# Patient Record
Sex: Male | Born: 1980 | Race: White | Hispanic: No | Marital: Married | Smoking: Never smoker
Health system: Southern US, Community
[De-identification: ages and names within clinical notes are randomized; demographics above are authoritative.]

## PROBLEM LIST (undated history)

## (undated) DIAGNOSIS — I10 Essential (primary) hypertension: Secondary | ICD-10-CM

## (undated) DIAGNOSIS — Z87442 Personal history of urinary calculi: Secondary | ICD-10-CM

## (undated) DIAGNOSIS — F419 Anxiety disorder, unspecified: Secondary | ICD-10-CM

## (undated) DIAGNOSIS — M93003 Unspecified slipped upper femoral epiphysis (nontraumatic), unspecified hip: Secondary | ICD-10-CM

## (undated) DIAGNOSIS — M9380 Other specified osteochondropathies of unspecified site: Secondary | ICD-10-CM

## (undated) HISTORY — DX: Other specified osteochondropathies of unspecified site: M93.80

## (undated) HISTORY — DX: Essential (primary) hypertension: I10

## (undated) HISTORY — DX: Unspecified slipped upper femoral epiphysis (nontraumatic), unspecified hip: M93.003

## (undated) HISTORY — DX: Anxiety disorder, unspecified: F41.9

## (undated) HISTORY — PX: ANKLE FRACTURE SURGERY: SHX122

---
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
## 2002-05-15 HISTORY — PX: APPENDECTOMY: SHX54

## 2014-10-14 ENCOUNTER — Ambulatory Visit (INDEPENDENT_AMBULATORY_CARE_PROVIDER_SITE_OTHER): Payer: 59 | Admitting: Family Medicine

## 2014-10-14 ENCOUNTER — Encounter: Payer: Self-pay | Admitting: Family Medicine

## 2014-10-14 VITALS — BP 131/88 | HR 74 | Temp 98.1°F | Wt 258.0 lb

## 2014-10-14 DIAGNOSIS — E669 Obesity, unspecified: Secondary | ICD-10-CM

## 2014-10-14 DIAGNOSIS — I10 Essential (primary) hypertension: Secondary | ICD-10-CM

## 2014-10-14 DIAGNOSIS — I129 Hypertensive chronic kidney disease with stage 1 through stage 4 chronic kidney disease, or unspecified chronic kidney disease: Secondary | ICD-10-CM | POA: Insufficient documentation

## 2014-10-14 DIAGNOSIS — E7800 Pure hypercholesterolemia, unspecified: Secondary | ICD-10-CM

## 2014-10-14 DIAGNOSIS — Z8659 Personal history of other mental and behavioral disorders: Secondary | ICD-10-CM | POA: Diagnosis not present

## 2014-10-14 DIAGNOSIS — J309 Allergic rhinitis, unspecified: Secondary | ICD-10-CM | POA: Insufficient documentation

## 2014-10-14 DIAGNOSIS — R231 Pallor: Secondary | ICD-10-CM

## 2014-10-14 DIAGNOSIS — J302 Other seasonal allergic rhinitis: Secondary | ICD-10-CM | POA: Diagnosis not present

## 2014-10-14 NOTE — Assessment & Plan Note (Signed)
I would not be inclined to start him on stimulant with HTN; discussed other options (life coach, exercise, frequent timed breaks, etc.)

## 2014-10-14 NOTE — Assessment & Plan Note (Signed)
Stable, [place]; may use OTC oral or nasal corticosteroid

## 2014-10-14 NOTE — Patient Instructions (Addendum)
Your goal blood pressure is less than 140/90; ideal is less than 120/80 Try to limit the sodium in your diet.  Ideally, consume less than 1.5 grams (less than 1,500mg ) per day. Do not add salt when cooking or at the table.  Check the sodium amount on labels when shopping, and choose items lower in sodium when given a choice. Eat a diet rich in fruits and vegetables and whole grains. We'll contact you about your labs or you can enroll in MyChart

## 2014-10-14 NOTE — Assessment & Plan Note (Signed)
DASH guidelines encouraged; he will try to increase activity, lose weight; continue current meds; may refill prior to next appt if needed;  Last creatinine reviewed, normal

## 2014-10-14 NOTE — Progress Notes (Signed)
[name], male   DOB: Feb 15, 1981, 34 y.o.   MRN: [id number]   Subjective:   Gordon Carson is a 34 y.o. male for routine f/u for blood pressure and other issues  He checks his BP occasionally and it runs about what today's reading is; no high readings since last visit He can tell when BP is up at the end of a stressful day; he is doing much better with limiting salt Going to change jobs soon, getting married in August; having to study for licensing exams Weight fluctuates Nonsmoker, occasional alcohol Fairly sedentary lifestyle  Took escitalopram but not any more; doing well in those regards with anxiety  Allergies have been fair this year; takes Zyrtec as needed  He brought up another issue at the end of the visit; hx of ADHD; his sister was diagnosed with ADHD, and mother thought that sounded like patient; he actually took low-dose Adderall during Freshman and Sophomore years in college; high school had been easy; did well in college but not sure he would have if he hadn't had the extra help; then went to grad school and went on 5 mg Adderall, lower dose than was needed in college; that was helpful for him with studying; just wanted to ask  Past Medical History  Diagnosis Date  . Anxiety   . Hypertension   . Slipped epiphysis    Past Surgical History  Procedure Laterality Date  . Hip surgery  1998    pin placed  . Appendectomy  2004   Family History  Problem Relation Age of Onset  . Cancer Mother     breast  . Hypertension Maternal Grandfather   . Stroke Maternal Grandfather   . Heart disease Maternal Grandfather    History  Substance Use Topics  . Smoking status: Never Smoker   . Smokeless tobacco: Never Used  . Alcohol Use: No   Current Outpatient Prescriptions on File Prior to Visit  Medication Sig Dispense Refill  . amLODipine (NORVASC) 2.5 MG tablet Take 2.5 mg by mouth daily.    . cetirizine (ZYRTEC) 10 MG tablet Take 10 mg  by mouth daily.    . fluticasone (FLONASE) 50 MCG/ACT nasal spray Place into both nostrils daily.     No current facility-administered medications on file prior to visit.   Allergies  Allergen Reactions  . Codeine    ------------- Review of Systems  Constitutional: Negative for fever.  HENT: Negative for congestion, ear pain and nosebleeds.   Cardiovascular: Positive for palpitations. Negative for leg swelling.       Heart beat skips once or twice a day, nothing new  Gastrointestinal: Negative for blood in stool.  Skin: Negative for rash.  Psychiatric/Behavioral: The patient is not nervous/anxious and does not have insomnia.     No results found for: WBC, HGB, HCT, PLT, GLUCOSE, CHOL, TRIG, HDL, LDLDIRECT, LDLCALC, ALT, AST, NA, K, CL, CREATININE, BUN, CO2, TSH, PSA, INR, GLUF, HGBA1C, MICROALBUR  Objective:   Filed Vitals:   10/14/14 1506  BP: 131/88  Pulse: 74  Temp: 98.1 F (36.7 C)  Weight: 258 lb (117.028 kg)  SpO2: 97%   Body mass index is 34.05 kg/(m^2). Physical Exam  Constitutional: He appears well-developed and well-nourished.  obese  HENT:  Mucous membranes moist, no significant rhinorrhea  Eyes: Conjunctivae and EOM are normal.  Neck: No thyromegaly present.  Cardiovascular: Normal rate, regular rhythm and normal heart sounds.   No extrasystoles are present. Exam reveals  no friction rub.   No murmur heard. Pulmonary/Chest: Effort normal and breath sounds normal. No respiratory distress. He has no wheezes.  Abdominal:  nondistended  Musculoskeletal: He exhibits no edema.  No gross deformities  Neurological: He is alert. He has normal strength. He displays no tremor. Gait normal.  Normal gait, no tics or tremors  Skin: No rash noted. No cyanosis. There is pallor. Nails show no clubbing.  Psychiatric:  Good eye contact with examiner, euthymic    Assessment/Plan:   Problem List Items Addressed This Visit      Cardiovascular and Mediastinum    Essential hypertension, benign - Primary    DASH guidelines encouraged; he will try to increase activity, lose weight; continue current meds; may refill prior to next appt if needed;  Last creatinine reviewed, normal        Respiratory   Allergic rhinitis    Stable, exacerbated [place] the [place]; may use OTC oral or nasal corticosteroid        Other   Obesity    What It Takes to Lose Weight hand-out from [email or web address] given to the patient; encouraged weight loss of 1-2 pounds per week; do not use OTC weight loss pills or gimmicks that claim rapid weight loss      History of ADHD    I would not be inclined to start him on stimulant with HTN; discussed other options (life coach, exercise, frequent timed breaks, etc.)      Elevated cholesterol    Reviewed last LDL with him; check again around November with next visit; he'll work on weight loss and increasing activity and whole grains       Other Visit Diagnoses    Skin pallor        check CBC and ferritin, as patient's nails are paler than examiner's    Relevant Orders    CBC w/Diff    Ferritin       Follow up plan: Return in about 6 months (around 04/15/2015) for high blood pressure, obesity, modestly elevated cholesterol.  An After Visit Summary was printed and given to the patient.

## 2014-10-14 NOTE — Assessment & Plan Note (Signed)
Reviewed last LDL with him; check again around November with next visit; he'll work on weight loss and increasing activity and whole grains

## 2014-10-14 NOTE — Assessment & Plan Note (Signed)
What It Takes to Lose Weight hand-out from [email or web address] given to the patient; encouraged weight loss of 1-2 pounds per week; do not use OTC weight loss pills or gimmicks that claim rapid weight loss

## 2014-10-15 ENCOUNTER — Encounter: Payer: Self-pay | Admitting: Family Medicine

## 2014-10-15 LAB — CBC WITH DIFFERENTIAL/PLATELET
BASOS: 1 %
Basophils Absolute: 0 10*3/uL (ref 0.0–0.2)
EOS (ABSOLUTE): 0.2 10*3/uL (ref 0.0–0.4)
Eos: 2 %
HEMOGLOBIN: 15.9 g/dL (ref 12.6–17.7)
Hematocrit: 47.4 % (ref 37.5–51.0)
IMMATURE GRANS (ABS): 0 10*3/uL (ref 0.0–0.1)
IMMATURE GRANULOCYTES: 0 %
Lymphocytes Absolute: 2.3 10*3/uL (ref 0.7–3.1)
Lymphs: 27 %
MCH: 30.1 pg (ref 26.6–33.0)
MCHC: 33.5 g/dL (ref 31.5–35.7)
MCV: 90 fL (ref 79–97)
MONOS ABS: 0.5 10*3/uL (ref 0.1–0.9)
Monocytes: 6 %
NEUTROS ABS: 5.4 10*3/uL (ref 1.4–7.0)
NEUTROS PCT: 64 %
PLATELETS: 255 10*3/uL (ref 150–379)
RBC: 5.28 x10E6/uL (ref 4.14–5.80)
RDW: 13.7 % (ref 12.3–15.4)
WBC: 8.5 10*3/uL (ref 3.4–10.8)

## 2014-10-15 LAB — FERRITIN: FERRITIN: 380 ng/mL (ref 30–400)

## 2014-11-02 ENCOUNTER — Ambulatory Visit (INDEPENDENT_AMBULATORY_CARE_PROVIDER_SITE_OTHER): Payer: 59 | Admitting: Family Medicine

## 2014-11-02 ENCOUNTER — Encounter: Payer: Self-pay | Admitting: Family Medicine

## 2014-11-02 VITALS — BP 122/90 | HR 76 | Ht 73.0 in | Wt 258.0 lb

## 2014-11-02 DIAGNOSIS — I10 Essential (primary) hypertension: Secondary | ICD-10-CM | POA: Diagnosis not present

## 2014-11-02 DIAGNOSIS — F419 Anxiety disorder, unspecified: Secondary | ICD-10-CM | POA: Diagnosis not present

## 2014-11-02 MED ORDER — LORAZEPAM 0.5 MG PO TABS: 0.5000 mg | ORAL_TABLET | Freq: Three times a day (TID) | ORAL | Status: DC | PRN

## 2014-11-02 NOTE — Patient Instructions (Signed)

## 2014-11-02 NOTE — Assessment & Plan Note (Addendum)
Appears to have had a panic attack. EKG is not working today. Discussed with patient options of going to [place] for EKG tonight vs having one here tomorrow. Patient does not feel need to have one now. Will go to ER if starts with discomfort again tonight. Will restart his ativan which he took previously. Discussed that this is a short-term medicine. If he is finding that he is needing to use it more often or feeling more out of control, will return for further evaluation and possibly restarting his lexapro. Will return tomorrow for EKG. Continue to monitor.

## 2014-11-02 NOTE — Progress Notes (Signed)
BP 122/90 mmHg  Pulse 76  Ht 6\' 1"  (1.854 m)  Wt 258 lb (117.028 kg)  BMI 34.05 kg/m2  SpO2 99%   Subjective:    [name], male    DOB: May 11, 1981, 34 y.o.   MRN: [id number]  HPI: Gordon Carson is a 34 y.o. male  Chief Complaint  Patient presents with  . Hypertension    Patient states that he feels like his blood pressure is high  . Anxiety    States that today he had a "Full Blown Panic Attack"   HYPERTENSION Hypertension status: controlled Satisfied with current treatment? yes Duration of hypertension: chronic BP monitoring frequency:  rarely BP medication side effects:  no Medication compliance: excellent compliance Aspirin: no Recurrent headaches: no Visual changes: yes Palpitations: yes Dyspnea: yes Chest pain: no Lower extremity edema: no Dizzy/lightheaded: yes  ANXIETY/STRESS- had a panic attack at a funeral today. Was able to breath through it and feeling much better now, but came over because he was afraid. He has had panic attacks before, but not in 2 years. He has had a lot on his plate right now and thinks that things just got overwhelming.  Duration: chronic- worse today and the past several days Anxious mood: yes  Excessive worrying: yes Irritability: yes  Sweating: yes Nausea: yes Palpitations:yes Hyperventilation: yes Panic attacks: yes Agoraphobia: no  Obscessions/compulsions: no Depressed mood: no No flowsheet data found. Anhedonia: no Weight changes: no Insomnia: no   Hypersomnia: no Fatigue/loss of energy: no Feelings of worthlessness: no Feelings of guilt: no Impaired concentration/indecisiveness: yes Suicidal ideations: no  Crying spells: no   Family stress: yes     Financial stress: yes    Job stress: yes    Recent death/loss: yes  Relevant past medical, surgical, family and social history reviewed and updated as indicated. Interim medical history since our last visit reviewed. Allergies and  medications reviewed and updated.  Review of Systems  Constitutional: Negative.   Respiratory: Negative.   Cardiovascular: Negative.   Psychiatric/Behavioral: Positive for decreased concentration. Negative for suicidal ideas, hallucinations, behavioral problems, confusion, sleep disturbance, self-injury and dysphoric mood. The patient is nervous/anxious. The patient is not hyperactive.     Per HPI unless specifically indicated above     Objective:    BP 122/90 mmHg  Pulse 76  Ht 6\' 1"  (1.854 m)  Wt 258 lb (117.028 kg)  BMI 34.05 kg/m2  SpO2 99%  Wt Readings from Last 3 Encounters:  11/02/14 258 lb (117.028 kg)  10/14/14 258 lb (117.028 kg)  06/12/14 259 lb (117.482 kg)    Physical Exam  Constitutional: He is oriented to person, place, and time. He appears well-developed and well-nourished. No distress.  HENT:  Head: Normocephalic and atraumatic.  Eyes: Conjunctivae are normal. Pupils are equal, round, and reactive to light.  Cardiovascular: Normal rate, regular rhythm, normal heart sounds and intact distal pulses.  Exam reveals no gallop and no friction rub.   No murmur heard. Pulmonary/Chest: Effort normal and breath sounds normal. No respiratory distress. He has no wheezes. He has no rales. He exhibits no tenderness.  Neurological: He is alert and oriented to person, place, and time.  Skin: Skin is warm and dry. No rash noted. He is not diaphoretic. No erythema. No pallor.  Psychiatric: His behavior is normal. Judgment normal.  Anxious mood  Nursing note and vitals reviewed.    Assessment & Plan:   Problem List Items Addressed This Visit  Cardiovascular and Mediastinum   Essential hypertension, benign    Under good control today. Continue current regimen. Continue to monitor.         Other   Acute anxiety - Primary    Appears to have had a panic attack. EKG is not working today. Discussed with patient options of going to [facility] for EKG tonight vs  having one here tomorrow. Patient does not feel need to have one now. Will go to ER if starts with discomfort again tonight. Will restart his ativan which he took previously. Discussed that this is a short-term medicine. If he is finding that he is needing to use it more often or feeling more out of control, will return for further evaluation and possibly restarting his lexapro. Will return tomorrow for EKG. Continue to monitor.       Relevant Medications   LORazepam (ATIVAN) 0.5 MG tablet       Follow up plan: Return in about 1 day (around 11/03/2014) for EKG at lunch time only.

## 2014-11-02 NOTE — Assessment & Plan Note (Signed)
Under good control today. Continue current regimen. Continue to monitor.  

## 2014-11-03 ENCOUNTER — Ambulatory Visit (INDEPENDENT_AMBULATORY_CARE_PROVIDER_SITE_OTHER): Payer: 59 | Admitting: Family Medicine

## 2014-11-03 DIAGNOSIS — R002 Palpitations: Secondary | ICD-10-CM

## 2014-11-09 ENCOUNTER — Other Ambulatory Visit: Payer: Self-pay | Admitting: Family Medicine

## 2014-11-09 NOTE — Telephone Encounter (Signed)
For  refill

## 2014-11-17 ENCOUNTER — Encounter: Payer: Self-pay | Admitting: Family Medicine

## 2014-11-17 NOTE — Progress Notes (Signed)
Here today for EKG only. Not a visit.

## 2015-03-02 ENCOUNTER — Other Ambulatory Visit: Payer: Self-pay | Admitting: Family Medicine

## 2015-03-03 NOTE — Telephone Encounter (Signed)
Routing to provider  

## 2015-03-04 NOTE — Telephone Encounter (Signed)
rx approved

## 2015-04-15 ENCOUNTER — Ambulatory Visit: Payer: 59 | Admitting: Family Medicine

## 2015-04-20 ENCOUNTER — Encounter: Payer: Self-pay | Admitting: Physician Assistant

## 2015-04-20 ENCOUNTER — Ambulatory Visit: Payer: Self-pay | Admitting: Physician Assistant

## 2015-04-20 VITALS — BP 120/80 | HR 93 | Temp 98.6°F

## 2015-04-20 DIAGNOSIS — J069 Acute upper respiratory infection, unspecified: Secondary | ICD-10-CM

## 2015-04-20 MED ORDER — AMOXICILLIN 875 MG PO TABS: 875.0000 mg | ORAL_TABLET | Freq: Two times a day (BID) | ORAL | Status: DC

## 2015-04-20 NOTE — Progress Notes (Signed)
S: C/o runny nose and congestion for 3 days, + fever, chills last pm, denies cp/sob, v/d; mucus was green this am but clear throughout the day, cough is sporadic,   Using otc meds: robitussin  O: PE: perrl eomi, normocephalic, tms dull, nasal mucosa red and swollen, throat injected, neck supple no lymph, lungs c t a, cv rrr, neuro intact  A:  Acute  uri   P: amoxil 875mg  bid x 10d; drink fluids, continue regular meds , use otc meds of choice, return if not improving in 5 days, return earlier if worsening

## 2015-04-23 ENCOUNTER — Ambulatory Visit: Payer: Self-pay | Admitting: Physician Assistant

## 2015-04-23 ENCOUNTER — Encounter: Payer: Self-pay | Admitting: Physician Assistant

## 2015-04-23 VITALS — BP 110/80 | HR 75 | Temp 98.6°F

## 2015-04-23 DIAGNOSIS — J069 Acute upper respiratory infection, unspecified: Secondary | ICD-10-CM

## 2015-04-23 NOTE — Progress Notes (Signed)
S: c/o cough and congestion, started taking amoxil on Tues, felt worse last night and earlier this morning but now seems to feel a little better, was told to return if worsening so just wanted to be checked, denies cp/sob/fever  O: vitals wnl, nad, ENT wnl except throat red, neck supple no lymph, lungs c t a, cv rrr  A:  Acute upper respiratory  P: continue amoxil, otc mucinex, return on Monday if not better or worsening

## 2015-05-07 ENCOUNTER — Ambulatory Visit (INDEPENDENT_AMBULATORY_CARE_PROVIDER_SITE_OTHER): Payer: PRIVATE HEALTH INSURANCE | Admitting: Family Medicine

## 2015-05-07 ENCOUNTER — Encounter: Payer: Self-pay | Admitting: Family Medicine

## 2015-05-07 VITALS — BP 142/90 | HR 92 | Temp 97.0°F | Ht 72.5 in | Wt 259.6 lb

## 2015-05-07 DIAGNOSIS — Z0000 Encounter for general adult medical examination without abnormal findings: Secondary | ICD-10-CM

## 2015-05-07 DIAGNOSIS — F419 Anxiety disorder, unspecified: Secondary | ICD-10-CM

## 2015-05-07 DIAGNOSIS — I10 Essential (primary) hypertension: Secondary | ICD-10-CM

## 2015-05-07 DIAGNOSIS — R002 Palpitations: Secondary | ICD-10-CM | POA: Insufficient documentation

## 2015-05-07 DIAGNOSIS — J302 Other seasonal allergic rhinitis: Secondary | ICD-10-CM

## 2015-05-07 DIAGNOSIS — E669 Obesity, unspecified: Secondary | ICD-10-CM

## 2015-05-07 DIAGNOSIS — E7800 Pure hypercholesterolemia, unspecified: Secondary | ICD-10-CM

## 2015-05-07 MED ORDER — AMLODIPINE BESYLATE 5 MG PO TABS: 5.0000 mg | ORAL_TABLET | Freq: Every day | ORAL | Status: DC

## 2015-05-07 MED ORDER — LORAZEPAM 0.5 MG PO TABS: 0.5000 mg | ORAL_TABLET | Freq: Three times a day (TID) | ORAL | Status: DC | PRN

## 2015-05-07 NOTE — Assessment & Plan Note (Addendum)
Reviewed EKG done by Dr. Wynetta Emery earlier this year; will check thyroid, electrolytes, get a holter monitor; suspect benign PVCs or PACs

## 2015-05-07 NOTE — Assessment & Plan Note (Addendum)
Encouraged him to work on weight loss, build up activity slowly; see AVS for recommendations

## 2015-05-07 NOTE — Assessment & Plan Note (Addendum)
Refills provided, using benzo very sparingly; no concerns for misuse or diversion; stressed to NEVER mix benzo with alcohol

## 2015-05-07 NOTE — Assessment & Plan Note (Addendum)
Encouraged him to work on weight loss; increase the amlodipine from 2.5 to 5 mg; DASH guidelines encouraged, see AVS; avoid decongestants

## 2015-05-07 NOTE — Progress Notes (Signed)
BP 142/90 mmHg  Pulse 92  Temp(Src) 97 F (36.1 C)  Ht 6' 0.5" (1.842 m)  Wt 259 lb 9.6 oz (117.754 kg)  BMI 34.71 kg/m2  SpO2 98%   Subjective:    [name], male    DOB: 1980-12-01, 34 y.o.   MRN: [id number]  HPI: Gordon Carson is a 34 y.o. male  Chief Complaint  Patient presents with  . Follow-up    Patient states everything is going good.  . Hypertension  . Palpitations    At night time when laying in bed, it will flutter.   Marland Kitchen Anxiety    Needs some more medicine. Using sparingly, but almost gone.    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 Wynetta Emery, reviewed today; he has just one cup of coffee in the morning; occasionally has tea at  lunch  Has anxiety; only uses it for rare moments; just having it available; just knowing he has it is calming, like Adela Lank' security blanket; not out of it; just relaxes; knows to not mix with alcohol, not a drinker anyway  Allergies; things are fine, hay gets him  He will be getting a flu shot at home; his step-father is a doctor and apparently has brought a flu shot home for him and it's just waiting for him in the refrigerator  Relevant past medical, surgical,  family and social history reviewed and updated as indicated  Interim medical history since our last visit reviewed. 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  Review of Systems Per HPI unless specifically indicated above     Objective:    BP 142/90 mmHg  Pulse 92  Temp(Src) 97 F (36.1 C)  Ht 6' 0.5" (1.842 m)  Wt 259 lb 9.6 oz (117.754 kg)  BMI 34.71 kg/m2  SpO2 98%  Wt Readings from Last 3 Encounters:  05/07/15 259 lb 9.6 oz (117.754 kg)  11/02/14 258 lb (117.028 kg)  10/14/14 258 lb (117.028 kg)    Physical Exam  Constitutional: He appears well-developed and well-nourished. No distress.  Obese, weight stable  HENT:  Head: Normocephalic and atraumatic.  Eyes: EOM are normal. No scleral icterus.  Neck: No thyromegaly present.  Cardiovascular: Normal rate and regular rhythm.   No extrasystoles are present. PMI is not displaced.   Pulmonary/Chest: Effort normal and breath sounds normal.  Abdominal: Soft. Bowel sounds are normal. He exhibits no distension.  Musculoskeletal: He exhibits no edema.  Neurological: Coordination normal.  Skin: Skin is warm and dry. No pallor.  Psychiatric: He has a normal  mood and affect. His behavior is normal. Judgment and thought content normal.      Assessment & Plan:   Problem List Items Addressed This Visit      Cardiovascular and Mediastinum   Essential hypertension, benign - Primary    Encouraged him to work on weight loss; increase the amlodipine from 2.5 to 5 mg; DASH guidelines encouraged, see AVS; avoid decongestants      Relevant Medications   amLODipine (NORVASC) 5 MG tablet     Respiratory   Allergic rhinitis    Avoid decongestants        Other   Obesity    Encouraged him to work on weight loss, build up activity slowly; see AVS for recommendations      Elevated cholesterol    Check lipids today, not truly fasting, he had muffin and  cream and sugar in coffee earlier today; weight loss should help, as well as healthier eating      Relevant Medications   amLODipine (NORVASC) 5 MG tablet   Acute anxiety    Refills provided, using benzo very sparingly; no concerns for misuse or diversion; stressed to NEVER mix benzo with alcohol      Relevant Medications   LORazepam (ATIVAN) 0.5 MG tablet   Palpitations    Reviewed EKG done by Dr. Wynetta Emery earlier this year; will check thyroid, electrolytes, get a holter monitor; suspect benign PVCs or PACs      Relevant Orders   TSH (Completed)   T3, free (Completed)   T4, free (Completed)   Magnesium (Completed)   Holter monitor - 24 hour    Other Visit Diagnoses    Preventative health care        Relevant Orders    CBC with Differential/Platelet (Completed)    Lipid Panel w/o Chol/HDL Ratio (Completed)    Comprehensive metabolic panel (Completed)       Follow up plan: Return in about 6 months (around 11/05/2015) for visit with Dr. Sanda Klein; return in 3 weeks to see CMA for blood pressure.  Orders Placed This Encounter  Procedures  . CBC with Differential/Platelet  . Lipid Panel w/o Chol/HDL Ratio  . TSH  . T3, free  . T4, free  . Magnesium  . Comprehensive metabolic panel  . Holter monitor - 24 hour   An after-visit summary was printed and given to the patient at [facility].  Please see the patient instructions which may contain other information and recommendations beyond what is mentioned above in the assessment and plan.  Meds ordered this encounter  Medications  . amLODipine (NORVASC) 5 MG tablet    Sig: Take 1 tablet (5 mg total) by mouth daily.    Dispense:  30 tablet    Refill:  6    Increasing dose  . LORazepam (ATIVAN) 0.5 MG tablet    Sig: Take 1 tablet (0.5 mg total) by mouth every 8 (eight) hours as needed for anxiety.    Dispense:  15 tablet    Refill:  0

## 2015-05-07 NOTE — Patient Instructions (Addendum)
Try to use PLAIN allergy medicine without the decongestant Avoid: phenylephrine, phenylpropanolamine, and pseudoephredine Please do eat yogurt daily or take a probiotic daily for the next month or two We want to replace the healthy germs in the gut If you notice foul, watery diarrhea in the next two months, schedule an appointment RIGHT AWAY Check out the information at [email or web address] entitled "What It Takes to Lose Weight" Try to lose between 1-2 pounds per week by taking in fewer calories and burning off more calories You can succeed by limiting portions, limiting foods dense in calories and fat, becoming more active, and drinking 8 glasses of water a day (64 ounces) Don't skip meals, especially breakfast, as skipping meals may alter your metabolism Do not use over-the-counter weight loss pills or gimmicks that claim rapid weight loss A healthy BMI (or body mass index) is between 18.5 and 24.9 You can calculate your ideal BMI at the [place] website ClubMonetize.fr We'll check labs today and get a holter monitor If you have not heard anything from my staff in a week about any orders/referrals/studies from today, please contact us here to follow-up [phone] Increase your amlodipine from 2.5 mg daily to 5 mg daily Return for a blood pressure check in 3 weeks with AMY; if your blood pressure is not controlled that day, we'll adjust medicine Your goal blood pressure is less than 140 mmHg on top and less than 90 on the bottom Try to follow the DASH guidelines (DASH stands for Dietary Approaches to Stop Hypertension) Try to limit the sodium in your diet.  Ideally, consume less than 1.5 grams (less than 1,500mg ) per day. Do not add salt when cooking or at the table.  Check the sodium amount on labels when shopping, and choose items lower in sodium when given a choice. Avoid or limit foods that already contain a lot of sodium. Eat a diet rich in  fruits and vegetables and whole grains. Return to see me in 6 months    DASH Eating Plan DASH stands for "Dietary Approaches to Stop Hypertension." The DASH eating plan is a healthy eating plan that has been shown to reduce high blood pressure (hypertension). Additional health benefits may include reducing the risk of type 2 diabetes mellitus, heart disease, and stroke. The DASH eating plan may also help with weight loss. WHAT DO I NEED TO KNOW ABOUT THE DASH EATING PLAN? For the DASH eating plan, you will follow these general guidelines:  Choose foods with a percent daily value for sodium of less than 5% (as listed on the food label).  Use salt-free seasonings or herbs instead of table salt or sea salt.  Check with your health care provider or pharmacist before using salt substitutes.  Eat lower-sodium products, often labeled as "lower sodium" or "no salt added."  Eat fresh foods.  Eat more vegetables, fruits, and low-fat dairy products.  Choose whole grains. Look for the word "whole" as the first word in the ingredient list.  Choose fish and skinless chicken or [place] more often than red meat. Limit fish, poultry, and meat to 6 oz (170 g) each day.  Limit sweets, desserts, sugars, and sugary drinks.  Choose heart-healthy fats.  Limit cheese to 1 oz (28 g) per day.  Eat more home-cooked food and less restaurant, buffet, and fast food.  Limit fried foods.  Cook foods using methods other than frying.  Limit canned vegetables. If you do use them, rinse them well to decrease the sodium.  When eating at a restaurant, ask that your food be prepared with less salt, or no salt if possible. WHAT FOODS CAN I EAT? Seek help from a dietitian for individual calorie needs. Grains Whole grain or whole wheat bread. Brown rice. Whole grain or whole wheat pasta. Quinoa, bulgur, and whole grain cereals. Low-sodium cereals. Corn or whole wheat flour tortillas. Whole grain cornbread. Whole  grain crackers. Low-sodium crackers. Vegetables Fresh or frozen vegetables (raw, steamed, roasted, or grilled). Low-sodium or reduced-sodium tomato and vegetable juices. Low-sodium or reduced-sodium tomato sauce and paste. Low-sodium or reduced-sodium canned vegetables.  Fruits All fresh, canned (in natural juice), or frozen fruits. Meat and Other Protein Products Ground beef (85% or leaner), grass-fed beef, or beef trimmed of fat. Skinless chicken or [place]. Ground chicken or [place]. Pork trimmed of fat. All fish and seafood. Eggs. Dried beans, peas, or lentils. Unsalted nuts and seeds. Unsalted canned beans. Dairy Low-fat dairy products, such as skim or 1% milk, 2% or reduced-fat cheeses, low-fat ricotta or cottage cheese, or plain low-fat yogurt. Low-sodium or reduced-sodium cheeses. Fats and Oils Tub margarines without trans fats. Light or reduced-fat mayonnaise and salad dressings (reduced sodium). Avocado. Safflower, olive, or canola oils. Natural peanut or almond butter. Other Unsalted popcorn and pretzels. The items listed above may not be a complete list of recommended foods or beverages. Contact your dietitian for more options. WHAT FOODS ARE NOT RECOMMENDED? Grains White bread. White pasta. White rice. Refined cornbread. Bagels and croissants. Crackers that contain trans fat. Vegetables Creamed or fried vegetables. Vegetables in a cheese sauce. Regular canned vegetables. Regular canned tomato sauce and paste. Regular tomato and vegetable juices. Fruits Dried fruits. Canned fruit in light or heavy syrup. Fruit juice. Meat and Other Protein Products Fatty cuts of meat. Ribs, chicken wings, bacon, sausage, bologna, salami, chitterlings, fatback, hot dogs, bratwurst, and packaged luncheon meats. Salted nuts and seeds. Canned beans with salt. Dairy Whole or 2% milk, cream, half-and-half, and cream cheese. Whole-fat or sweetened yogurt. Full-fat cheeses or blue cheese. Nondairy creamers  and whipped toppings. Processed cheese, cheese spreads, or cheese curds. Condiments Onion and garlic salt, seasoned salt, table salt, and sea salt. Canned and packaged gravies. Worcestershire sauce. Tartar sauce. Barbecue sauce. Teriyaki sauce. Soy sauce, including reduced sodium. Steak sauce. Fish sauce. Oyster sauce. Cocktail sauce. Horseradish. Ketchup and mustard. Meat flavorings and tenderizers. Bouillon cubes. Hot sauce. Tabasco sauce. Marinades. Taco seasonings. Relishes. Fats and Oils Butter, stick margarine, lard, shortening, ghee, and bacon fat. Coconut, palm kernel, or palm oils. Regular salad dressings. Other Pickles and olives. Salted popcorn and pretzels. The items listed above may not be a complete list of foods and beverages to avoid. Contact your dietitian for more information. WHERE CAN I FIND MORE INFORMATION? National Heart, Lung, and Blood Institute: travelstabloid.com   This information is not intended to replace advice given to you by your health care provider. Make sure you discuss any questions you have with your health care provider.   Document Released: 04/20/2011 Document Revised: 05/22/2014 Document Reviewed: 03/05/2013 Elsevier Interactive Patient Education [employer].

## 2015-05-07 NOTE — Assessment & Plan Note (Addendum)
Check lipids today, not truly fasting, he had muffin and cream and sugar in coffee earlier today; weight loss should help, as well as healthier eating

## 2015-05-08 ENCOUNTER — Encounter: Payer: Self-pay | Admitting: Family Medicine

## 2015-05-08 LAB — COMPREHENSIVE METABOLIC PANEL
A/G RATIO: 1.6 (ref 1.1–2.5)
ALBUMIN: 4.3 g/dL (ref 3.5–5.5)
ALK PHOS: 70 IU/L (ref 39–117)
ALT: 41 IU/L (ref 0–44)
AST: 23 IU/L (ref 0–40)
BILIRUBIN TOTAL: 0.7 mg/dL (ref 0.0–1.2)
BUN / CREAT RATIO: 21 — AB (ref 8–19)
BUN: 17 mg/dL (ref 6–20)
CO2: 25 mmol/L (ref 18–29)
CREATININE: 0.82 mg/dL (ref 0.76–1.27)
Calcium: 9.5 mg/dL (ref 8.7–10.2)
Chloride: 103 mmol/L (ref 96–106)
GFR calc Af Amer: 134 mL/min/{1.73_m2} (ref >59)
GFR calc non Af Amer: 116 mL/min/{1.73_m2} (ref >59)
GLOBULIN, TOTAL: 2.7 g/dL (ref 1.5–4.5)
Glucose: 82 mg/dL (ref 65–99)
POTASSIUM: 5.1 mmol/L (ref 3.5–5.2)
SODIUM: 141 mmol/L (ref 134–144)
Total Protein: 7 g/dL (ref 6.0–8.5)

## 2015-05-08 LAB — CBC WITH DIFFERENTIAL/PLATELET
BASOS ABS: 0 10*3/uL (ref 0.0–0.2)
BASOS: 1 %
EOS (ABSOLUTE): 0.2 10*3/uL (ref 0.0–0.4)
Eos: 3 %
HEMOGLOBIN: 15.1 g/dL (ref 12.6–17.7)
Hematocrit: 45.2 % (ref 37.5–51.0)
Immature Grans (Abs): 0 10*3/uL (ref 0.0–0.1)
Immature Granulocytes: 0 %
LYMPHS ABS: 1.8 10*3/uL (ref 0.7–3.1)
Lymphs: 27 %
MCH: 29.5 pg (ref 26.6–33.0)
MCHC: 33.4 g/dL (ref 31.5–35.7)
MCV: 88 fL (ref 79–97)
MONOCYTES: 7 %
Monocytes Absolute: 0.5 10*3/uL (ref 0.1–0.9)
NEUTROS ABS: 4.1 10*3/uL (ref 1.4–7.0)
Neutrophils: 62 %
Platelets: 272 10*3/uL (ref 150–379)
RBC: 5.12 x10E6/uL (ref 4.14–5.80)
RDW: 13.5 % (ref 12.3–15.4)
WBC: 6.6 10*3/uL (ref 3.4–10.8)

## 2015-05-08 LAB — LIPID PANEL W/O CHOL/HDL RATIO
CHOLESTEROL TOTAL: 205 mg/dL — AB (ref 100–199)
HDL: 35 mg/dL — ABNORMAL LOW (ref >39)
LDL Calculated: 132 mg/dL — ABNORMAL HIGH (ref 0–99)
TRIGLYCERIDES: 191 mg/dL — AB (ref 0–149)
VLDL Cholesterol Cal: 38 mg/dL (ref 5–40)

## 2015-05-08 LAB — TSH: TSH: 1.06 u[IU]/mL (ref 0.450–4.500)

## 2015-05-08 LAB — T4, FREE: Free T4: 1.34 ng/dL (ref 0.82–1.77)

## 2015-05-08 LAB — T3, FREE: T3, Free: 3.9 pg/mL (ref 2.0–4.4)

## 2015-05-08 LAB — MAGNESIUM: MAGNESIUM: 2.3 mg/dL (ref 1.6–2.3)

## 2015-05-08 NOTE — Assessment & Plan Note (Signed)
Avoid decongestants 

## 2015-05-27 ENCOUNTER — Ambulatory Visit (INDEPENDENT_AMBULATORY_CARE_PROVIDER_SITE_OTHER): Payer: BLUE CROSS/BLUE SHIELD

## 2015-05-27 VITALS — BP 137/84 | HR 93 | Wt 266.0 lb

## 2015-05-27 DIAGNOSIS — I10 Essential (primary) hypertension: Secondary | ICD-10-CM

## 2015-05-27 NOTE — Progress Notes (Signed)
Patient came in for BP check. He's doing well. Notices his BP is a little higher at night, will sometimes be in the 140's/80s. Still some "heart flutters" but no major complaints.  Per Dr. lada increase the Amlodipine to 10mg  and recheck BP in 2-3 weeks.

## 2015-05-27 NOTE — Patient Instructions (Signed)
Per Dr. Sanda Klein increase Amlodipine to 10mg  and recheck bp in 2-3 weeks.

## 2015-06-01 ENCOUNTER — Other Ambulatory Visit: Payer: Self-pay | Admitting: Family Medicine

## 2015-06-01 MED ORDER — AMLODIPINE BESYLATE 10 MG PO TABS: 10.0000 mg | ORAL_TABLET | Freq: Every day | ORAL | Status: DC

## 2015-06-01 NOTE — Telephone Encounter (Signed)
You increased him to 10mg  after BP check last week, I updated his med list. He needs new rx.

## 2015-06-01 NOTE — Telephone Encounter (Signed)
Pt would like amlodipine 10mg  went to [facility] [address]

## 2015-06-18 ENCOUNTER — Ambulatory Visit: Payer: PRIVATE HEALTH INSURANCE | Admitting: Family Medicine

## 2015-06-22 ENCOUNTER — Encounter: Payer: Self-pay | Admitting: Family Medicine

## 2015-06-22 ENCOUNTER — Ambulatory Visit (INDEPENDENT_AMBULATORY_CARE_PROVIDER_SITE_OTHER): Payer: BLUE CROSS/BLUE SHIELD | Admitting: Family Medicine

## 2015-06-22 VITALS — BP 129/92 | HR 75 | Temp 97.2°F | Wt 264.0 lb

## 2015-06-22 DIAGNOSIS — E669 Obesity, unspecified: Secondary | ICD-10-CM

## 2015-06-22 DIAGNOSIS — E7800 Pure hypercholesterolemia, unspecified: Secondary | ICD-10-CM

## 2015-06-22 DIAGNOSIS — I10 Essential (primary) hypertension: Secondary | ICD-10-CM

## 2015-06-22 DIAGNOSIS — Z79899 Other long term (current) drug therapy: Secondary | ICD-10-CM | POA: Diagnosis not present

## 2015-06-22 DIAGNOSIS — F419 Anxiety disorder, unspecified: Secondary | ICD-10-CM | POA: Diagnosis not present

## 2015-06-22 DIAGNOSIS — R002 Palpitations: Secondary | ICD-10-CM | POA: Diagnosis not present

## 2015-06-22 MED ORDER — LISINOPRIL 5 MG PO TABS: 5.0000 mg | ORAL_TABLET | Freq: Every day | ORAL | Status: DC

## 2015-06-22 MED ORDER — HYDROCHLOROTHIAZIDE 12.5 MG PO TABS: 12.5000 mg | ORAL_TABLET | Freq: Every day | ORAL | Status: DC

## 2015-06-22 MED ORDER — LORAZEPAM 0.5 MG PO TABS: 0.5000 mg | ORAL_TABLET | Freq: Three times a day (TID) | ORAL | Status: DC | PRN

## 2015-06-22 NOTE — Assessment & Plan Note (Signed)
Refill given of benzo to use periodically, just when needed; controlled substance agreement signed today; copy given to patient; do not mix anxiety med and pain pills; agree that anxiety can affect BP

## 2015-06-22 NOTE — Progress Notes (Signed)
BP 129/92 mmHg  Pulse 75  Temp(Src) 97.2 F (36.2 C)  Wt 264 lb (119.75 kg)  SpO2 100%   Subjective:    [name], male    DOB: 1981/05/10, 35 y.o.   MRN: [id number]  HPI: Gordon Carson is a 35 y.o. male  Chief Complaint  Patient presents with  . Hypertension    We increased his Amlodipine to 10mg  at his last BP check.    He is here for follow-up of blood pressure He is checking his blood pressure at night; can tell it goes up I asked if it is the stress of the work day [employer] for bike ride and run yesterday 132/90 last night; 140/95 before that; 132/94 Last week 158/103, 152/98 He takes his amlodipine at night; had been taking it every morning No personal or fam hx of gout Reviewed chol panel with him; he is trying to exercise and watch his diet Taking lorazepam in the evenings when BP is high; maybe 2-3 times a week  Relevant past medical, surgical, family and social history reviewed and updated as indicated.  Mother's side has HTN, grandfather Interim medical history since our last visit reviewed. Allergies and medications reviewed and updated.  Review of Systems  Cardiovascular: Negative for chest pain and leg swelling.  Psychiatric/Behavioral:       Anxiety  Per HPI unless specifically indicated above     Objective:    BP 129/92 mmHg  Pulse 75  Temp(Src) 97.2 F (36.2 C)  Wt 264 lb (119.75 kg)  SpO2 100%  Wt Readings from Last 3 Encounters:  06/22/15 264 lb (119.75 kg)  05/27/15 266 lb (120.657 kg)  05/07/15 259 lb 9.6 oz (117.754 kg)    Physical Exam  Constitutional: He appears well-developed and well-nourished. No distress.  Obese, weight down two pounds since last visit  HENT:  Head: Normocephalic and atraumatic.  Eyes: EOM are normal. No scleral icterus.  Neck: No thyromegaly present.  Cardiovascular: Normal rate and regular rhythm.   Pulmonary/Chest: Effort normal and breath sounds normal.  Abdominal: Soft.  Bowel sounds are normal. He exhibits no distension.  Musculoskeletal: He exhibits no edema (indentions from elastic from socks on leg).  Neurological: Coordination normal.  Skin: Skin is warm and dry. No pallor.  Psychiatric: He has a normal mood and affect. His behavior is normal. Judgment and thought content normal.   Results for orders placed or performed in visit on 05/07/15  CBC with Differential/Platelet  Result Value Ref Range   WBC 6.6 3.4 - 10.8 x10E3/uL   RBC 5.12 4.14 - 5.80 x10E6/uL   Hemoglobin 15.1 12.6 - 17.7 g/dL   Hematocrit 45.2 37.5 - 51.0 %   MCV 88 79 - 97 fL   MCH 29.5 26.6 - 33.0 pg   MCHC 33.4 31.5 - 35.7 g/dL   RDW 13.5 12.3 - 15.4 %   Platelets 272 150 - 379 x10E3/uL   Neutrophils 62 %   Lymphs 27 %   Monocytes 7 %   Eos 3 %   Basos 1 %   Neutrophils Absolute 4.1 1.4 - 7.0 x10E3/uL   Lymphocytes Absolute 1.8 0.7 - 3.1 x10E3/uL   Monocytes Absolute 0.5 0.1 - 0.9 x10E3/uL   EOS (ABSOLUTE) 0.2 0.0 - 0.4 x10E3/uL   Basophils Absolute 0.0 0.0 - 0.2 x10E3/uL   Immature Granulocytes 0 %   Immature Grans (Abs) 0.0 0.0 - 0.1 x10E3/uL  Lipid Panel w/o Chol/HDL Ratio  Result Value  Ref Range   Cholesterol, Total 205 (H) 100 - 199 mg/dL   Triglycerides 191 (H) 0 - 149 mg/dL   HDL 35 (L) >39 mg/dL   VLDL Cholesterol Cal 38 5 - 40 mg/dL   LDL Calculated 132 (H) 0 - 99 mg/dL  TSH  Result Value Ref Range   TSH 1.060 0.450 - 4.500 uIU/mL  T3, free  Result Value Ref Range   T3, Free 3.9 2.0 - 4.4 pg/mL  T4, free  Result Value Ref Range   Free T4 1.34 0.82 - 1.77 ng/dL  Magnesium  Result Value Ref Range   Magnesium 2.3 1.6 - 2.3 mg/dL  Comprehensive metabolic panel  Result Value Ref Range   Glucose 82 65 - 99 mg/dL   BUN 17 6 - 20 mg/dL   Creatinine, Ser 0.82 0.76 - 1.27 mg/dL   GFR calc non Af Amer 116 >59 mL/min/1.73   GFR calc Af Amer 134 >59 mL/min/1.73   BUN/Creatinine Ratio 21 (H) 8 - 19   Sodium 141 134 - 144 mmol/L   Potassium 5.1 3.5 - 5.2  mmol/L   Chloride 103 96 - 106 mmol/L   CO2 25 18 - 29 mmol/L   Calcium 9.5 8.7 - 10.2 mg/dL   Total Protein 7.0 6.0 - 8.5 g/dL   Albumin 4.3 3.5 - 5.5 g/dL   Globulin, Total 2.7 1.5 - 4.5 g/dL   Albumin/Globulin Ratio 1.6 1.1 - 2.5   Bilirubin Total 0.7 0.0 - 1.2 mg/dL   Alkaline Phosphatase 70 39 - 117 IU/L   AST 23 0 - 40 IU/L   ALT 41 0 - 44 IU/L      Assessment & Plan:   Problem List Items Addressed This Visit      Cardiovascular and Mediastinum   Essential hypertension, benign - Primary    Still not controlled; try to follow DASH guidelines; work on weight loss; add ACE-I n the morning; continue CCB at night; return in 3 weeks for recheck and BMP; I was going to use HCTZ but then realized that might make palpitations worse      Relevant Medications   lisinopril (PRINIVIL,ZESTRIL) 5 MG tablet     Other   Obesity    Patient has lost two pounds since last visit, acknowledged, encouragement given      Elevated cholesterol    Work on weight loss      Relevant Medications   lisinopril (PRINIVIL,ZESTRIL) 5 MG tablet   Acute anxiety    Refill given of benzo to use periodically, just when needed; controlled substance agreement signed today; copy given to patient; do not mix anxiety med and pain pills; agree that anxiety can affect BP      Relevant Medications   LORazepam (ATIVAN) 0.5 MG tablet   Palpitations    Holter monitor was ordered previously; patient has not heard back about that; reminder sent to staff to please make that happen; on new medicine, if more palpitations, eat banana or drink OJ, and call before next visit if not resolved; can check Mg2+ and BMP then      Controlled substance agreement signed    Copy given to patient today; do not mix controlled substances, especially pain medicine, with the med I prescribe; no concern for misuse or diversion         Follow up plan: Return in about 3 weeks (around 07/13/2015) for recheck with non-fasting  labs.  An after-visit summary was printed and given to the  patient at [facility].  Please see the patient instructions which may contain other information and recommendations beyond what is mentioned above in the assessment and plan. Meds ordered this encounter  Medications  . DISCONTD: hydrochlorothiazide (HYDRODIURIL) 12.5 MG tablet    Sig: Take 1 tablet (12.5 mg total) by mouth daily.    Dispense:  30 tablet    Refill:  0  . LORazepam (ATIVAN) 0.5 MG tablet    Sig: Take 1 tablet (0.5 mg total) by mouth every 8 (eight) hours as needed for anxiety.    Dispense:  15 tablet    Refill:  1  . lisinopril (PRINIVIL,ZESTRIL) 5 MG tablet    Sig: Take 1 tablet (5 mg total) by mouth daily.    Dispense:  30 tablet    Refill:  0    Do NOT fill the HCTZ -- we are using ACE-I instead

## 2015-06-22 NOTE — Assessment & Plan Note (Signed)
Holter monitor was ordered previously; patient has not heard back about that; reminder sent to staff to please make that happen; on new medicine, if more palpitations, eat banana or drink OJ, and call before next visit if not resolved; can check Mg2+ and BMP then

## 2015-06-22 NOTE — Assessment & Plan Note (Addendum)
Still not controlled; try to follow DASH guidelines; work on weight loss; add ACE-I n the morning; continue CCB at night; return in 3 weeks for recheck and BMP; I was going to use HCTZ but then realized that might make palpitations worse

## 2015-06-22 NOTE — Patient Instructions (Addendum)
Your goal blood pressure is less than 140 mmHg on top. Try to follow the DASH guidelines (DASH stands for Dietary Approaches to Stop Hypertension) Try to limit the sodium in your diet.  Ideally, consume less than 1.5 grams (less than 1,500mg ) per day. Do not add salt when cooking or at the table.  Check the sodium amount on labels when shopping, and choose items lower in sodium when given a choice. Avoid or limit foods that already contain a lot of sodium. Eat a diet rich in fruits and vegetables and whole grains. Start the new blood pressure medicine in the mornings Keep taking the other BP med in the evenings Keep up the good efforts at weight loss; try to lose 1 pound a week

## 2015-06-22 NOTE — Assessment & Plan Note (Signed)
Patient has lost two pounds since last visit, acknowledged, encouragement given

## 2015-06-22 NOTE — Assessment & Plan Note (Signed)
Work on weight loss.

## 2015-06-22 NOTE — Assessment & Plan Note (Signed)
Copy given to patient today; do not mix controlled substances, especially pain medicine, with the med I prescribe; no concern for misuse or diversion

## 2015-06-24 ENCOUNTER — Ambulatory Visit: Payer: Self-pay

## 2015-06-24 ENCOUNTER — Ambulatory Visit
Admission: RE | Admit: 2015-06-24 | Discharge: 2015-06-24 | Disposition: A | Discharge status: Home / Self Care | Payer: BLUE CROSS/BLUE SHIELD | Source: Ambulatory Visit | Attending: Family Medicine | Admitting: Family Medicine

## 2015-06-24 DIAGNOSIS — R002 Palpitations: Secondary | ICD-10-CM | POA: Diagnosis not present

## 2015-07-14 ENCOUNTER — Ambulatory Visit: Payer: BLUE CROSS/BLUE SHIELD | Admitting: Family Medicine

## 2015-07-19 ENCOUNTER — Encounter: Payer: Self-pay | Admitting: Family Medicine

## 2015-07-19 ENCOUNTER — Ambulatory Visit (INDEPENDENT_AMBULATORY_CARE_PROVIDER_SITE_OTHER): Payer: BLUE CROSS/BLUE SHIELD | Admitting: Family Medicine

## 2015-07-19 VITALS — BP 144/79 | HR 64 | Temp 97.0°F | Wt 260.0 lb

## 2015-07-19 DIAGNOSIS — I10 Essential (primary) hypertension: Secondary | ICD-10-CM | POA: Diagnosis not present

## 2015-07-19 DIAGNOSIS — Z5181 Encounter for therapeutic drug level monitoring: Secondary | ICD-10-CM | POA: Diagnosis not present

## 2015-07-19 DIAGNOSIS — E669 Obesity, unspecified: Secondary | ICD-10-CM | POA: Diagnosis not present

## 2015-07-19 DIAGNOSIS — J302 Other seasonal allergic rhinitis: Secondary | ICD-10-CM | POA: Diagnosis not present

## 2015-07-19 NOTE — Patient Instructions (Signed)
Try to use PLAIN allergy medicine without the decongestant Avoid: phenylephrine, phenylpropanolamine, and pseudoephredine If you need something for aches or pains, try to use Tylenol (acetaminphen) instead of non-steroidals (which include Aleve, ibuprofen, Advil, Motrin, and naproxen); non-steroidals can cause long-term kidney damage Okay to start the juice, and then have potassium level checked five days later

## 2015-07-19 NOTE — Assessment & Plan Note (Signed)
Check BMP today; check K+ five days after starting juicing

## 2015-07-19 NOTE — Progress Notes (Signed)
BP 144/79 mmHg  Pulse 64  Temp(Src) 97 F (36.1 C)  Wt 260 lb (117.935 kg)  SpO2 99%   Subjective:    [name], male    DOB: [birth year], 35 y.o.   MRN: [id number]  HPI: Gordon Carson is a 35 y.o. male  Chief Complaint  Patient presents with  . Hypertension    follow up   Here for f/u of high blood pressure; he checks BP away from here; mornings are 120/80; 138/90 in the evenings; none higher Might be having some salt at supper He might go eat [place] and can tell; stress and salt makes it go up at night He has lost 4 more pounds since February (4 weeks ago) and is down 6 pounds altogether since 6 weeks ago; he is eating better and more active; not drinking much water; cut back on tea and sodas Thinking about doing a juice fast with apples, cucumber, kale, celery, ginger  His holter monitor came back, PACs and PVCs, no worrisome rhythms Med working well for anxiety Allergies under control, 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  Review of Systems  Per HPI unless specifically indicated above     Objective:    BP 144/79 mmHg  Pulse 64  Temp(Src) 97 F (36.1 C)  Wt 260 lb (117.935 kg)  SpO2 99%  Wt Readings from Last 3 Encounters:  07/19/15 260 lb (117.935 kg)  06/22/15 264 lb (119.75 kg)  05/27/15 266 lb (120.657 kg)    Physical Exam  Constitutional: He appears well-developed and well-nourished. No distress.  Eyes: No scleral icterus.  Neck: No thyromegaly present.  Cardiovascular: Normal rate and regular rhythm.   Pulmonary/Chest: Effort normal and breath sounds normal.  Musculoskeletal: He exhibits no edema.  Neurological: Coordination normal.  Skin: Skin is warm and dry. No pallor.  Psychiatric: He has a normal mood and affect.   Results for orders placed  or performed in visit on 05/07/15  CBC with Differential/Platelet  Result Value Ref Range   WBC 6.6 3.4 - 10.8 x10E3/uL   RBC 5.12 4.14 - 5.80 x10E6/uL   Hemoglobin 15.1 12.6 - 17.7 g/dL   Hematocrit 45.2 37.5 - 51.0 %   MCV 88 79 - 97 fL   MCH 29.5 26.6 - 33.0 pg   MCHC 33.4 31.5 - 35.7 g/dL   RDW 13.5 12.3 - 15.4 %   Platelets 272 150 - 379 x10E3/uL   Neutrophils 62 %   Lymphs 27 %   Monocytes 7 %   Eos 3 %   Basos 1 %   Neutrophils Absolute 4.1 1.4 - 7.0 x10E3/uL   Lymphocytes Absolute 1.8 0.7 - 3.1 x10E3/uL   Monocytes Absolute 0.5 0.1 - 0.9 x10E3/uL   EOS (ABSOLUTE) 0.2 0.0 - 0.4 x10E3/uL   Basophils Absolute 0.0 0.0 - 0.2 x10E3/uL   Immature Granulocytes 0 %   Immature Grans (Abs) 0.0 0.0 - 0.1 x10E3/uL  Lipid Panel w/o Chol/HDL Ratio  Result Value Ref Range   Cholesterol, Total 205 (H) 100 - 199 mg/dL   Triglycerides 191 (H) 0 - 149 mg/dL   HDL 35 (L) >39 mg/dL   VLDL Cholesterol Cal 38 5 - 40 mg/dL   LDL Calculated 132 (H) 0 - 99 mg/dL  TSH  Result Value Ref Range   TSH 1.060 0.450 - 4.500  uIU/mL  T3, free  Result Value Ref Range   T3, Free 3.9 2.0 - 4.4 pg/mL  T4, free  Result Value Ref Range   Free T4 1.34 0.82 - 1.77 ng/dL  Magnesium  Result Value Ref Range   Magnesium 2.3 1.6 - 2.3 mg/dL  Comprehensive metabolic panel  Result Value Ref Range   Glucose 82 65 - 99 mg/dL   BUN 17 6 - 20 mg/dL   Creatinine, Ser 0.82 0.76 - 1.27 mg/dL   GFR calc non Af Amer 116 >59 mL/min/1.73   GFR calc Af Amer 134 >59 mL/min/1.73   BUN/Creatinine Ratio 21 (H) 8 - 19   Sodium 141 134 - 144 mmol/L   Potassium 5.1 3.5 - 5.2 mmol/L   Chloride 103 96 - 106 mmol/L   CO2 25 18 - 29 mmol/L   Calcium 9.5 8.7 - 10.2 mg/dL   Total Protein 7.0 6.0 - 8.5 g/dL   Albumin 4.3 3.5 - 5.5 g/dL   Globulin, Total 2.7 1.5 - 4.5 g/dL   Albumin/Globulin Ratio 1.6 1.1 - 2.5   Bilirubin Total 0.7 0.0 - 1.2 mg/dL   Alkaline Phosphatase 70 39 - 117 IU/L   AST 23 0 - 40 IU/L   ALT 41 0 - 44  IU/L      Assessment & Plan:   Problem List Items Addressed This Visit      Cardiovascular and Mediastinum   Essential hypertension, benign - Primary    He wishes to keep the medicines where they are; he'll continue to work on weight loss; check BMP today; if he wants to do a juicing cleanse, be safe, don't try to lose excessive weight quickly; cautioned about excessive K+ load and to recheck K+ here in the office five days after starting the cleanse        Respiratory   Allergic rhinitis    Avoid decongestants if med needed; plain antihistamine okay        Other   Obesity    He is making good progress, another 4 pounds down in 4 weeks; caution about cleanses and juicing; if he chooses to do something like that, don't do anything that promises rapid weight loss (10 pounds in a week, e.g.); check K+ five days after starting juice; as he continues to lose weight, I hope we'll be able to decrease BP med      Medication monitoring encounter    Check BMP today; check K+ five days after starting juicing      Relevant Orders   Basic Metabolic Panel (BMET)   Potassium       Follow up plan: Return in about 3 months (around 10/19/2015) for blood pressure, healthy lifestyle changes.  Orders Placed This Encounter  Procedures  . Basic Metabolic Panel (BMET)  . Potassium

## 2015-07-19 NOTE — Assessment & Plan Note (Signed)
He wishes to keep the medicines where they are; he'll continue to work on weight loss; check BMP today; if he wants to do a juicing cleanse, be safe, don't try to lose excessive weight quickly; cautioned about excessive K+ load and to recheck K+ here in the office five days after starting the cleanse

## 2015-07-19 NOTE — Assessment & Plan Note (Signed)
Avoid decongestants if med needed; plain antihistamine okay

## 2015-07-19 NOTE — Assessment & Plan Note (Signed)
He is making good progress, another 4 pounds down in 4 weeks; caution about cleanses and juicing; if he chooses to do something like that, don't do anything that promises rapid weight loss (10 pounds in a week, e.g.); check K+ five days after starting juice; as he continues to lose weight, I hope we'll be able to decrease BP med

## 2015-07-20 ENCOUNTER — Other Ambulatory Visit: Payer: Self-pay | Admitting: Family Medicine

## 2015-07-20 LAB — BASIC METABOLIC PANEL
BUN / CREAT RATIO: 16 (ref 8–19)
BUN: 13 mg/dL (ref 6–20)
CO2: 27 mmol/L (ref 18–29)
CREATININE: 0.81 mg/dL (ref 0.76–1.27)
Calcium: 9.2 mg/dL (ref 8.7–10.2)
Chloride: 95 mmol/L — ABNORMAL LOW (ref 96–106)
GFR calc Af Amer: 135 mL/min/{1.73_m2} (ref >59)
GFR, EST NON AFRICAN AMERICAN: 117 mL/min/{1.73_m2} (ref >59)
Glucose: 84 mg/dL (ref 65–99)
Potassium: 4.7 mmol/L (ref 3.5–5.2)
SODIUM: 136 mmol/L (ref 134–144)

## 2015-07-20 MED ORDER — LISINOPRIL 5 MG PO TABS: 5.0000 mg | ORAL_TABLET | Freq: Every day | ORAL | Status: DC

## 2015-11-04 ENCOUNTER — Other Ambulatory Visit: Payer: Self-pay | Admitting: Family Medicine

## 2015-11-04 ENCOUNTER — Encounter: Payer: Self-pay | Admitting: Family Medicine

## 2015-11-04 ENCOUNTER — Ambulatory Visit (INDEPENDENT_AMBULATORY_CARE_PROVIDER_SITE_OTHER): Payer: BLUE CROSS/BLUE SHIELD | Admitting: Family Medicine

## 2015-11-04 VITALS — BP 123/78 | HR 71 | Temp 98.5°F | Ht 72.5 in | Wt 253.0 lb

## 2015-11-04 DIAGNOSIS — E7800 Pure hypercholesterolemia, unspecified: Secondary | ICD-10-CM

## 2015-11-04 DIAGNOSIS — I10 Essential (primary) hypertension: Secondary | ICD-10-CM | POA: Diagnosis not present

## 2015-11-04 DIAGNOSIS — F419 Anxiety disorder, unspecified: Secondary | ICD-10-CM | POA: Diagnosis not present

## 2015-11-04 LAB — MICROALBUMIN, URINE WAIVED
CREATININE, URINE WAIVED: 200 mg/dL (ref 10–300)
MICROALB, UR WAIVED: 30 mg/L — AB (ref 0–19)
Microalb/Creat Ratio: <30 mg/g (ref <30)

## 2015-11-04 MED ORDER — LORAZEPAM 0.5 MG PO TABS: 0.5000 mg | ORAL_TABLET | Freq: Three times a day (TID) | ORAL | Status: DC | PRN

## 2015-11-04 MED ORDER — LISINOPRIL 5 MG PO TABS: 5.0000 mg | ORAL_TABLET | Freq: Every day | ORAL | Status: DC

## 2015-11-04 NOTE — Assessment & Plan Note (Signed)
Rechecking today. Not fasting (ate chic-fila). Await results. Continue to work on diet and exercise.

## 2015-11-04 NOTE — Assessment & Plan Note (Signed)
Stable. Needs refill of medicine. Using very occasionally, just likes having available if needed. Call with concerns. 30 pills should last at least 6 months.

## 2015-11-04 NOTE — Assessment & Plan Note (Signed)
Under good control at this time. Continue current regimen. Discussed monitoring to decrease as needed. Call with concerns, recheck 6 months.

## 2015-11-04 NOTE — Progress Notes (Signed)
BP 123/78 mmHg  Pulse 71  Temp(Src) 98.5 F (36.9 C)  Ht 6' 0.5" (1.842 m)  Wt 253 lb (114.76 kg)  BMI 33.82 kg/m2  SpO2 99%   Subjective:    [name], male    DOB: Jan 10, 1981, 35 y.o.   MRN: [id number]  HPI: Gordon Carson is a 35 y.o. male  Chief Complaint  Patient presents with  . Hypertension   HYPERTENSION / [place] Satisfied with current treatment? yes Duration of hypertension: chronic BP monitoring frequency: a few times a month BP medication side effects: no Duration of hyperlipidemia: past couple of  Cholesterol medication side effects: no Cholesterol supplements: none Past cholesterol medications: none Medication compliance: excellent compliance Aspirin: no Recent stressors: yes- just had a daughter 6 weeks ago Recurrent headaches: no Visual changes: no Palpitations: no Dyspnea: no Chest pain: no Lower extremity edema: no Dizzy/lightheaded: no  Relevant past medical, surgical, family and social history reviewed and updated as indicated. Interim medical history since our last visit reviewed. Allergies and medications reviewed and updated.  Review of Systems  Constitutional: Negative.   Respiratory: Negative.   Cardiovascular: Negative.   Psychiatric/Behavioral: Negative.     Per HPI unless specifically indicated above     Objective:    BP 123/78 mmHg  Pulse 71  Temp(Src) 98.5 F (36.9 C)  Ht 6' 0.5" (1.842 m)  Wt 253 lb (114.76 kg)  BMI 33.82 kg/m2  SpO2 99%  Wt Readings from Last 3 Encounters:  11/04/15 253 lb (114.76 kg)  07/19/15 260 lb (117.935 kg)  06/22/15 264 lb (119.75 kg)    Physical Exam  Constitutional: He is oriented to person, place, and time. He appears well-developed and well-nourished. No distress.  HENT:  Head: Normocephalic and atraumatic.  Right Ear: Hearing normal.  Left Ear: Hearing normal.  Nose: Nose normal.  Eyes: Conjunctivae and lids are normal. Right eye exhibits no  discharge. Left eye exhibits no discharge. No scleral icterus.  Cardiovascular: Normal rate, regular rhythm, normal heart sounds and intact distal pulses.  Exam reveals no gallop and no friction rub.   No murmur heard. Pulmonary/Chest: Effort normal and breath sounds normal. No respiratory distress. He has no wheezes. He has no rales. He exhibits no tenderness.  Musculoskeletal: Normal range of motion.  Neurological: He is alert and oriented to person, place, and time.  Skin: Skin is warm, dry and intact. No rash noted. No erythema. No pallor.  Psychiatric: He has a normal mood and affect. His speech is normal and behavior is normal. Judgment and thought content normal. Cognition and memory are normal.  Nursing note and vitals reviewed.   Results for orders placed or performed in visit on [id number]  Basic Metabolic Panel (BMET)  Result Value Ref Range   Glucose 84 65 - 99 mg/dL   BUN 13 6 - 20 mg/dL   Creatinine, Ser 0.81 0.76 - 1.27 mg/dL   GFR calc non Af Amer 117 >59 mL/min/1.73   GFR calc Af Amer 135 >59 mL/min/1.73   BUN/Creatinine Ratio 16 8 - 19   Sodium 136 134 - 144 mmol/L   Potassium 4.7 3.5 - 5.2 mmol/L   Chloride 95 (L) 96 - 106 mmol/L   CO2 27 18 - 29 mmol/L   Calcium 9.2 8.7 - 10.2 mg/dL      Assessment & Plan:   Problem List Items Addressed This Visit      Cardiovascular and Mediastinum   Essential hypertension, benign -  Primary    Under good control at this time. Continue current regimen. Discussed monitoring to decrease as needed. Call with concerns, recheck 6 months.       Relevant Medications   lisinopril (PRINIVIL,ZESTRIL) 5 MG tablet     Other   Elevated cholesterol    Rechecking today. Not fasting (ate chic-fila). Await results. Continue to work on diet and exercise.       Relevant Medications   lisinopril (PRINIVIL,ZESTRIL) 5 MG tablet   Acute anxiety    Stable. Needs refill of medicine. Using very occasionally, just likes having available if  needed. Call with concerns. 30 pills should last at least 6 months.       Relevant Medications   LORazepam (ATIVAN) 0.5 MG tablet       Follow up plan: Return in about 6 months (around 05/05/2016) for Physical.

## 2015-11-05 ENCOUNTER — Telehealth: Payer: Self-pay | Admitting: Family Medicine

## 2015-11-05 DIAGNOSIS — E785 Hyperlipidemia, unspecified: Secondary | ICD-10-CM

## 2015-11-05 LAB — COMPREHENSIVE METABOLIC PANEL
ALT: 28 IU/L (ref 0–44)
AST: 20 IU/L (ref 0–40)
Albumin/Globulin Ratio: 1.8 (ref 1.2–2.2)
Albumin: 4.7 g/dL (ref 3.5–5.5)
Alkaline Phosphatase: 71 IU/L (ref 39–117)
BILIRUBIN TOTAL: 0.3 mg/dL (ref 0.0–1.2)
BUN/Creatinine Ratio: 18 (ref 9–20)
BUN: 16 mg/dL (ref 6–20)
CALCIUM: 9.5 mg/dL (ref 8.7–10.2)
CHLORIDE: 100 mmol/L (ref 96–106)
CO2: 23 mmol/L (ref 18–29)
CREATININE: 0.9 mg/dL (ref 0.76–1.27)
GFR, EST AFRICAN AMERICAN: 129 mL/min/{1.73_m2} (ref >59)
GFR, EST NON AFRICAN AMERICAN: 112 mL/min/{1.73_m2} (ref >59)
GLUCOSE: 87 mg/dL (ref 65–99)
Globulin, Total: 2.6 g/dL (ref 1.5–4.5)
Potassium: 4.6 mmol/L (ref 3.5–5.2)
Sodium: 140 mmol/L (ref 134–144)
TOTAL PROTEIN: 7.3 g/dL (ref 6.0–8.5)

## 2015-11-05 LAB — LIPID PANEL W/O CHOL/HDL RATIO
Cholesterol, Total: 212 mg/dL — ABNORMAL HIGH (ref 100–199)
HDL: 31 mg/dL — AB (ref >39)
LDL Calculated: 113 mg/dL — ABNORMAL HIGH (ref 0–99)
Triglycerides: 340 mg/dL — ABNORMAL HIGH (ref 0–149)
VLDL CHOLESTEROL CAL: 68 mg/dL — AB (ref 5–40)

## 2015-11-05 NOTE — Telephone Encounter (Signed)
Please let him know that his labs looked good except his cholesterol which was borderline because he had eaten. We'll get him scheduled for a fasting lipid whenever he wants to come in. Thanks!

## 2015-11-05 NOTE — Telephone Encounter (Signed)
Patient notified, will come in on Monday for a repeat cholesterol.

## 2015-11-08 ENCOUNTER — Other Ambulatory Visit: Payer: BLUE CROSS/BLUE SHIELD

## 2015-11-08 DIAGNOSIS — E785 Hyperlipidemia, unspecified: Secondary | ICD-10-CM

## 2015-11-09 ENCOUNTER — Encounter: Payer: Self-pay | Admitting: Family Medicine

## 2015-11-09 LAB — LIPID PANEL W/O CHOL/HDL RATIO
CHOLESTEROL TOTAL: 205 mg/dL — AB (ref 100–199)
HDL: 35 mg/dL — ABNORMAL LOW (ref >39)
LDL CALC: 124 mg/dL — AB (ref 0–99)
Triglycerides: 232 mg/dL — ABNORMAL HIGH (ref 0–149)
VLDL CHOLESTEROL CAL: 46 mg/dL — AB (ref 5–40)

## 2015-11-18 ENCOUNTER — Telehealth: Payer: Self-pay | Admitting: Family Medicine

## 2015-11-18 ENCOUNTER — Ambulatory Visit (INDEPENDENT_AMBULATORY_CARE_PROVIDER_SITE_OTHER): Payer: BLUE CROSS/BLUE SHIELD | Admitting: Family Medicine

## 2015-11-18 ENCOUNTER — Encounter: Payer: Self-pay | Admitting: Family Medicine

## 2015-11-18 VITALS — BP 119/86 | HR 67 | Temp 98.1°F | Ht 72.5 in | Wt 253.0 lb

## 2015-11-18 DIAGNOSIS — R42 Dizziness and giddiness: Secondary | ICD-10-CM

## 2015-11-18 DIAGNOSIS — R002 Palpitations: Secondary | ICD-10-CM | POA: Diagnosis not present

## 2015-11-18 MED ORDER — PREDNISONE 50 MG PO TABS: 50.0000 mg | ORAL_TABLET | Freq: Every day | ORAL | Status: DC

## 2015-11-18 MED ORDER — MECLIZINE HCL 32 MG PO TABS: 32.0000 mg | ORAL_TABLET | Freq: Three times a day (TID) | ORAL | Status: DC | PRN

## 2015-11-18 MED ORDER — MECLIZINE HCL 25 MG PO TABS: 25.0000 mg | ORAL_TABLET | Freq: Three times a day (TID) | ORAL | Status: DC | PRN

## 2015-11-18 NOTE — Assessment & Plan Note (Signed)
EKG normal. Will check CMP with mag and phos. Await results.

## 2015-11-18 NOTE — Patient Instructions (Signed)
Benign Positional Vertigo Vertigo is the feeling that you or your surroundings are moving when they are not. Benign positional vertigo is the most common form of vertigo. The cause of this condition is not serious (is benign). This condition is triggered by certain movements and positions (is positional). This condition can be dangerous if it occurs while you are doing something that could endanger you or others, such as driving.  CAUSES In many cases, the cause of this condition is not known. It may be caused by a disturbance in an area of the inner ear that helps your brain to sense movement and balance. This disturbance can be caused by a viral infection (labyrinthitis), head injury, or repetitive motion. RISK FACTORS This condition is more likely to develop in:  Women.  People who are 50 years of age or older. SYMPTOMS Symptoms of this condition usually happen when you move your head or your eyes in different directions. Symptoms may start suddenly, and they usually last for less than a minute. Symptoms may include:  Loss of balance and falling.  Feeling like you are spinning or moving.  Feeling like your surroundings are spinning or moving.  Nausea and vomiting.  Blurred vision.  Dizziness.  Involuntary eye movement (nystagmus). Symptoms can be mild and cause only slight annoyance, or they can be severe and interfere with daily life. Episodes of benign positional vertigo may return (recur) over time, and they may be triggered by certain movements. Symptoms may improve over time. DIAGNOSIS This condition is usually diagnosed by medical history and a physical exam of the head, neck, and ears. You may be referred to a health care provider who specializes in ear, nose, and throat (ENT) problems (otolaryngologist) or a provider who specializes in disorders of the nervous system (neurologist). You may have additional testing, including:  MRI.  A CT scan.  Eye movement tests. Your  health care provider may ask you to change positions quickly while he or she watches you for symptoms of benign positional vertigo, such as nystagmus. Eye movement may be tested with an electronystagmogram (ENG), caloric stimulation, the Dix-Hallpike test, or the roll test.  An electroencephalogram (EEG). This records electrical activity in your brain.  Hearing tests. TREATMENT Usually, your health care provider will treat this by moving your head in specific positions to adjust your inner ear back to normal. Surgery may be needed in severe cases, but this is rare. In some cases, benign positional vertigo may resolve on its own in 2-4 weeks. HOME CARE INSTRUCTIONS Safety  Move slowly.Avoid sudden body or head movements.  Avoid driving.  Avoid operating heavy machinery.  Avoid doing any tasks that would be dangerous to you or others if a vertigo episode would occur.  If you have trouble walking or keeping your balance, try using a cane for stability. If you feel dizzy or unstable, sit down right away.  Return to your normal activities as told by your health care provider. Ask your health care provider what activities are safe for you. General Instructions  Take over-the-counter and prescription medicines only as told by your health care provider.  Avoid certain positions or movements as told by your health care provider.  Drink enough fluid to keep your urine clear or pale yellow.  Keep all follow-up visits as told by your health care provider. This is important. SEEK MEDICAL CARE IF:  You have a fever.  Your condition gets worse or you develop new symptoms.  Your family or friends   notice any behavioral changes.  Your nausea or vomiting gets worse.  You have numbness or a "pins and needles" sensation. SEEK IMMEDIATE MEDICAL CARE IF:  You have difficulty speaking or moving.  You are always dizzy.  You faint.  You develop severe headaches.  You have weakness in your  legs or arms.  You have changes in your hearing or vision.  You develop a stiff neck.  You develop sensitivity to light.   This information is not intended to replace advice given to you by your health care provider. Make sure you discuss any questions you have with your health care provider.   Document Released: 02/06/2006 Document Revised: 01/20/2015 Document Reviewed: 08/24/2014 Elsevier Interactive Patient Education 2016 [employer]  

## 2015-11-18 NOTE — Telephone Encounter (Signed)
Pt called stated there was an issue with the dosage of one of the medications he was prescribed today. Please call pt ASAP. Thanks.

## 2015-11-18 NOTE — Progress Notes (Signed)
BP 119/86 mmHg  Pulse 67  Temp(Src) 98.1 F (36.7 C)  Ht 6' 0.5" (1.842 m)  Wt 253 lb (114.76 kg)  BMI 33.82 kg/m2  SpO2 99%   Subjective:    [name], male    DOB: [birth year], 35 y.o.   MRN: [id number]  HPI: Gordon Carson is a 35 y.o. male  Chief Complaint  Patient presents with  . dizzy, light headed   DIZZINESS- Monday started feeling light headed and dizzy, was in the sun for 8 hours on Sunday. Thought it was dehydration. Has been pushing fluids and that's not helping. Has made sure that he drank electrolytes too Duration: 4 days Description of symptoms: lightheaded Duration of episode: 10-15 minutes Dizziness frequency: all day Provoking factors: none Aggravating factors:  none Triggered by rolling over in bed: no Triggered by bending over: no Aggravated by head movement: no Aggravated by exertion, coughing, loud noises: no Recent head injury: no Recent or current viral symptoms: no History of vasovagal episodes: no Nausea: yes- yesterday only Vomiting: no Tinnitus: no Hearing loss: no Aural fullness: no Headache: no Photophobia/phonophobia: no Unsteady gait: yes Postural instability: yes Diplopia, dysarthria, dysphagia or weakness: no Related to exertion: no Pallor: no Diaphoresis: no Dyspnea: no Chest pain: no  Relevant past medical, surgical, family and social history reviewed and updated as indicated. Interim medical history since our last visit reviewed. Allergies and medications reviewed and updated.  Review of Systems  Constitutional: Negative.   Respiratory: Negative.   Cardiovascular: Negative.   Neurological: Positive for dizziness and light-headedness. Negative for tremors, seizures, syncope, facial asymmetry, speech difficulty, weakness, numbness and headaches.  Psychiatric/Behavioral: Negative.     Per HPI unless specifically indicated above     Objective:    BP 119/86 mmHg  Pulse 67  Temp(Src)  98.1 F (36.7 C)  Ht 6' 0.5" (1.842 m)  Wt 253 lb (114.76 kg)  BMI 33.82 kg/m2  SpO2 99%  Wt Readings from Last 3 Encounters:  11/18/15 253 lb (114.76 kg)  11/04/15 253 lb (114.76 kg)  07/19/15 260 lb (117.935 kg)    Physical Exam  Constitutional: He is oriented to person, place, and time. He appears well-developed and well-nourished. No distress.  HENT:  Head: Normocephalic and atraumatic.  Right Ear: Hearing, external ear and ear canal normal. Tympanic membrane is bulging.  Left Ear: Hearing, external ear and ear canal normal. Tympanic membrane is bulging.  Nose: Nose normal.  Mouth/Throat: Uvula is midline, oropharynx is clear and moist and mucous membranes are normal. No oropharyngeal exudate.  Eyes: Conjunctivae and lids are normal. Pupils are equal, round, and reactive to light. Right eye exhibits no discharge. Left eye exhibits no discharge. Right conjunctiva is not injected. Right conjunctiva has no hemorrhage. Left conjunctiva is not injected. Left conjunctiva has no hemorrhage. No scleral icterus. Right eye exhibits nystagmus. Right eye exhibits normal extraocular motion. Left eye exhibits nystagmus. Left eye exhibits normal extraocular motion.  Dizziness reproduced with nystagmus  Cardiovascular: Normal rate, regular rhythm, normal heart sounds and intact distal pulses.  Exam reveals no gallop and no friction rub.   No murmur heard. Pulmonary/Chest: Effort normal and breath sounds normal. No respiratory distress. He has no wheezes. He has no rales. He exhibits no tenderness.  Musculoskeletal: Normal range of motion.  Neurological: He is alert and oriented to person, place, and time.  Skin: Skin is warm, dry and intact. No rash noted. He is not diaphoretic. No erythema. No pallor.  Psychiatric: He has a normal mood and affect. His speech is normal and behavior is normal. Judgment and thought content normal. Cognition and memory are normal.  Nursing note and vitals  reviewed.   Results for orders placed or performed in visit on 11/08/15  Lipid Panel w/o Chol/HDL Ratio  Result Value Ref Range   Cholesterol, Total 205 (H) 100 - 199 mg/dL   Triglycerides 232 (H) 0 - 149 mg/dL   HDL 35 (L) >39 mg/dL   VLDL Cholesterol Cal 46 (H) 5 - 40 mg/dL   LDL Calculated 124 (H) 0 - 99 mg/dL      Assessment & Plan:   Problem List Items Addressed This Visit      Other   Palpitations    EKG normal. Will check CMP with mag and phos. Await results.       Relevant Orders   EKG 12-Lead (Completed)   Comprehensive metabolic panel   Magnesium   Phosphorus    Other Visit Diagnoses    Vertigo    -  Primary    Will treat with prednisone and meclizine. Orthostatics and EKG normal. Check CMP. Epley's manuvers given. Call if not better by Monday.    Relevant Orders    Comprehensive metabolic panel    Magnesium    Phosphorus        Follow up plan: Return if symptoms worsen or fail to improve.

## 2015-11-18 NOTE — Telephone Encounter (Signed)
Rx changed to 25mg . Patient made aware.

## 2015-11-19 ENCOUNTER — Telehealth: Payer: Self-pay | Admitting: Family Medicine

## 2015-11-19 LAB — COMPREHENSIVE METABOLIC PANEL
A/G RATIO: 1.8 (ref 1.2–2.2)
ALK PHOS: 74 IU/L (ref 39–117)
ALT: 31 IU/L (ref 0–44)
AST: 19 IU/L (ref 0–40)
Albumin: 4.6 g/dL (ref 3.5–5.5)
BILIRUBIN TOTAL: 0.7 mg/dL (ref 0.0–1.2)
BUN/Creatinine Ratio: 11 (ref 9–20)
BUN: 11 mg/dL (ref 6–20)
CHLORIDE: 100 mmol/L (ref 96–106)
CO2: 24 mmol/L (ref 18–29)
Calcium: 9.6 mg/dL (ref 8.7–10.2)
Creatinine, Ser: 1.03 mg/dL (ref 0.76–1.27)
GFR calc non Af Amer: 94 mL/min/{1.73_m2} (ref >59)
GFR, EST AFRICAN AMERICAN: 109 mL/min/{1.73_m2} (ref >59)
GLUCOSE: 87 mg/dL (ref 65–99)
Globulin, Total: 2.5 g/dL (ref 1.5–4.5)
POTASSIUM: 4.9 mmol/L (ref 3.5–5.2)
Sodium: 141 mmol/L (ref 134–144)
Total Protein: 7.1 g/dL (ref 6.0–8.5)

## 2015-11-19 LAB — PHOSPHORUS: PHOSPHORUS: 3.2 mg/dL (ref 2.5–4.5)

## 2015-11-19 LAB — MAGNESIUM: MAGNESIUM: 2.2 mg/dL (ref 1.6–2.3)

## 2015-11-19 NOTE — Telephone Encounter (Signed)
Called to let Gordon Carson know that his labs came back nice and normal.

## 2016-01-13 ENCOUNTER — Other Ambulatory Visit: Payer: Self-pay | Admitting: Family Medicine

## 2016-01-19 ENCOUNTER — Other Ambulatory Visit: Payer: Self-pay | Admitting: Family Medicine

## 2016-06-21 ENCOUNTER — Encounter: Payer: Self-pay | Admitting: Family Medicine

## 2016-06-21 ENCOUNTER — Ambulatory Visit (INDEPENDENT_AMBULATORY_CARE_PROVIDER_SITE_OTHER): Payer: BLUE CROSS/BLUE SHIELD | Admitting: Family Medicine

## 2016-06-21 VITALS — BP 122/85 | HR 73 | Temp 98.8°F | Wt 251.0 lb

## 2016-06-21 DIAGNOSIS — J069 Acute upper respiratory infection, unspecified: Secondary | ICD-10-CM

## 2016-06-21 LAB — VERITOR FLU A/B WAIVED
INFLUENZA A: NEGATIVE
INFLUENZA B: NEGATIVE

## 2016-06-21 MED ORDER — AMOXICILLIN-POT CLAVULANATE 875-125 MG PO TABS: 1.0000 | ORAL_TABLET | Freq: Two times a day (BID) | ORAL | 0 refills | Status: DC

## 2016-06-21 NOTE — Patient Instructions (Signed)
Follow up as needed

## 2016-06-21 NOTE — Progress Notes (Signed)
   BP 122/85   Pulse 73   Temp 98.8 F (37.1 C)   Wt 251 lb (113.9 kg)   SpO2 98%   BMI 33.57 kg/m    Subjective:    [name], male    DOB: 1980/07/24, 36 y.o.   MRN: [id number]  HPI: Gordon Carson is a 36 y.o. male  Chief Complaint  Patient presents with  . URI    off and on x 1 month. head and chest congestion, sore throat, ears itchy, productive cough, No body aches, no fever.    HA, congestion, sore throat, ear itching, sinus pressure intermittently x 1 month. Denies fever, chills, aches, CP, N/V. Taking throat sprays, cough drops, and sinus relief medications. Lots of sick contacts.   Relevant past medical, surgical, family and social history reviewed and updated as indicated. Interim medical history since our last visit reviewed. Allergies and medications reviewed and updated.  Review of Systems  Constitutional: Negative.   HENT: Positive for congestion, sinus pressure and sore throat.   Eyes: Negative.   Respiratory: Positive for cough.   Cardiovascular: Negative.   Gastrointestinal: Negative.   Genitourinary: Negative.   Musculoskeletal: Negative.   Neurological: Positive for headaches.  Psychiatric/Behavioral: Negative.     Per HPI unless specifically indicated above     Objective:    BP 122/85   Pulse 73   Temp 98.8 F (37.1 C)   Wt 251 lb (113.9 kg)   SpO2 98%   BMI 33.57 kg/m   Wt Readings from Last 3 Encounters:  06/21/16 251 lb (113.9 kg)  11/18/15 253 lb (114.8 kg)  11/04/15 253 lb (114.8 kg)    Physical Exam  Constitutional: He is oriented to person, place, and time. He appears well-developed and well-nourished.  HENT:  Head: Atraumatic.  Right Ear: External ear normal.  Left Ear: External ear normal.  Mouth/Throat: No oropharyngeal exudate.  Oropharynx erythematous Nasal mucosa injected Sinuses non-tender to palpation  Eyes: Conjunctivae are normal. Pupils are equal, round, and reactive to light. No  scleral icterus.  Neck: Normal range of motion. Neck supple.  Cardiovascular: Normal rate and normal heart sounds.   Pulmonary/Chest: Effort normal and breath sounds normal. No respiratory distress.  Musculoskeletal: Normal range of motion.  Neurological: He is alert and oriented to person, place, and time.  Skin: Skin is warm and dry.  Psychiatric: He has a normal mood and affect. His behavior is normal.  Nursing note and vitals reviewed.     Assessment & Plan:   Problem List Items Addressed This Visit    None    Visit Diagnoses    Upper respiratory tract infection, unspecified type    -  Primary   Given duration of symptoms, will treat with augmentin. Recommended sudafed or mucinex, rest, and good hydration.    Relevant Orders   Influenza A & B (STAT)       Follow up plan: Return if symptoms worsen or fail to improve.

## 2016-07-14 ENCOUNTER — Other Ambulatory Visit: Payer: Self-pay | Admitting: Family Medicine

## 2016-08-07 ENCOUNTER — Other Ambulatory Visit: Payer: Self-pay | Admitting: Family Medicine

## 2016-08-07 DIAGNOSIS — G8929 Other chronic pain: Secondary | ICD-10-CM

## 2016-08-07 DIAGNOSIS — M79675 Pain in left toe(s): Principal | ICD-10-CM

## 2016-08-07 NOTE — Progress Notes (Signed)
Patient with intermittent bunion of his great toe pain. His is been ongoing intermittently for several months. Will check BMP and uric acid for gout.

## 2016-08-08 ENCOUNTER — Other Ambulatory Visit: Payer: BLUE CROSS/BLUE SHIELD

## 2016-08-08 DIAGNOSIS — M79675 Pain in left toe(s): Principal | ICD-10-CM

## 2016-08-08 DIAGNOSIS — G8929 Other chronic pain: Secondary | ICD-10-CM

## 2016-08-09 ENCOUNTER — Other Ambulatory Visit: Payer: BLUE CROSS/BLUE SHIELD

## 2016-08-09 LAB — BASIC METABOLIC PANEL
BUN/Creatinine Ratio: 14 (ref 9–20)
BUN: 11 mg/dL (ref 6–20)
CO2: 24 mmol/L (ref 18–29)
CREATININE: 0.79 mg/dL (ref 0.76–1.27)
Calcium: 9.5 mg/dL (ref 8.7–10.2)
Chloride: 99 mmol/L (ref 96–106)
GFR calc Af Amer: 135 mL/min/{1.73_m2} (ref >59)
GFR, EST NON AFRICAN AMERICAN: 117 mL/min/{1.73_m2} (ref >59)
Glucose: 80 mg/dL (ref 65–99)
Potassium: 4.3 mmol/L (ref 3.5–5.2)
SODIUM: 139 mmol/L (ref 134–144)

## 2016-08-09 LAB — URIC ACID: Uric Acid: 5.5 mg/dL (ref 3.7–8.6)

## 2016-08-10 ENCOUNTER — Encounter: Payer: Self-pay | Admitting: Family Medicine

## 2016-10-09 ENCOUNTER — Other Ambulatory Visit: Payer: Self-pay | Admitting: Family Medicine

## 2016-10-18 ENCOUNTER — Other Ambulatory Visit: Payer: Self-pay | Admitting: Family Medicine

## 2016-12-07 ENCOUNTER — Encounter: Payer: Self-pay | Admitting: Family Medicine

## 2016-12-07 ENCOUNTER — Ambulatory Visit (INDEPENDENT_AMBULATORY_CARE_PROVIDER_SITE_OTHER): Payer: BLUE CROSS/BLUE SHIELD | Admitting: Family Medicine

## 2016-12-07 VITALS — BP 118/78 | HR 66 | Temp 98.4°F | Ht 73.6 in | Wt 237.2 lb

## 2016-12-07 DIAGNOSIS — I129 Hypertensive chronic kidney disease with stage 1 through stage 4 chronic kidney disease, or unspecified chronic kidney disease: Secondary | ICD-10-CM

## 2016-12-07 DIAGNOSIS — E7800 Pure hypercholesterolemia, unspecified: Secondary | ICD-10-CM

## 2016-12-07 DIAGNOSIS — F419 Anxiety disorder, unspecified: Secondary | ICD-10-CM | POA: Diagnosis not present

## 2016-12-07 DIAGNOSIS — R42 Dizziness and giddiness: Secondary | ICD-10-CM

## 2016-12-07 MED ORDER — AMLODIPINE BESYLATE 10 MG PO TABS: ORAL_TABLET | ORAL | 1 refills | Status: DC

## 2016-12-07 MED ORDER — LISINOPRIL 5 MG PO TABS: 5.0000 mg | ORAL_TABLET | Freq: Every day | ORAL | 1 refills | Status: DC

## 2016-12-07 MED ORDER — LORAZEPAM 0.5 MG PO TABS: 0.5000 mg | ORAL_TABLET | Freq: Every day | ORAL | 0 refills | Status: DC | PRN

## 2016-12-07 MED ORDER — PREDNISONE 50 MG PO TABS: 50.0000 mg | ORAL_TABLET | Freq: Every day | ORAL | 0 refills | Status: DC

## 2016-12-07 NOTE — Progress Notes (Signed)
BP 118/78 (BP Location: Left Arm, Patient Position: Sitting, Cuff Size: Large)   Pulse 66   Temp 98.4 F (36.9 C)   Ht 6' 1.6" (1.869 m)   Wt 237 lb 3 oz (107.6 kg)   SpO2 99%   BMI 30.78 kg/m    Subjective:    [name], male    DOB: 1980/06/07, 36 y.o.   MRN: [id number]  HPI: Gordon Carson is a 36 y.o. male  Chief Complaint  Patient presents with  . Hypertension  . Dizziness   HYPERTENSION / HYPERLIPIDEMIA Satisfied with current treatment? yes Duration of hypertension: chronic BP monitoring frequency: a few times a week BP range: 110s-140s/70s-80s BP medication side effects: no Past BP meds: amlodipine, lisinopril Duration of hyperlipidemia: chronic Cholesterol medication side effects: Not on anything Cholesterol supplements: none Past cholesterol medications: none Medication compliance: excellent compliance Aspirin: no Recent stressors: no Recurrent headaches: no Visual changes: no Palpitations: no Dyspnea: no Chest pain: no Lower extremity edema: no Dizzy/lightheaded: yes  DIZZINESS Duration: 3 weeks Description of symptoms: lightheaded Duration of episode: minutes Dizziness frequency: every day at some time Provoking factors: none Aggravating factors:  Getting up drom the floor Triggered by rolling over in bed: no Triggered by bending over: yes Aggravated by head movement: no Aggravated by exertion, coughing, loud noises: no Recent head injury: no Recent or current viral symptoms: yes History of vasovagal episodes: no Nausea: no Vomiting: no Tinnitus: yes Hearing loss: no Aural fullness: no Headache: no Photophobia/phonophobia: no Unsteady gait: no Postural instability: no Diplopia, dysarthria, dysphagia or weakness: no Related to exertion: no Pallor: no Diaphoresis: no Dyspnea: no Chest pain: no   Relevant past medical, surgical, family and social history reviewed and updated as indicated. Interim medical  history since our last visit reviewed. Allergies and medications reviewed and updated.  Review of Systems  Constitutional: Negative.   Respiratory: Negative.   Cardiovascular: Negative.   Neurological: Positive for dizziness and light-headedness. Negative for tremors, seizures, syncope, facial asymmetry, speech difficulty, weakness, numbness and headaches.  Psychiatric/Behavioral: Negative.     Per HPI unless specifically indicated above     Objective:    BP 118/78 (BP Location: Left Arm, Patient Position: Sitting, Cuff Size: Large)   Pulse 66   Temp 98.4 F (36.9 C)   Ht 6' 1.6" (1.869 m)   Wt 237 lb 3 oz (107.6 kg)   SpO2 99%   BMI 30.78 kg/m   Wt Readings from Last 3 Encounters:  12/07/16 237 lb 3 oz (107.6 kg)  06/21/16 251 lb (113.9 kg)  11/18/15 253 lb (114.8 kg)    Orthostatic VS for the past 24 hrs:  BP- Lying Pulse- Lying BP- Sitting Pulse- Sitting BP- Standing at 0 minutes Pulse- Standing at 0 minutes  12/07/16 0840 113/77 66 114/83 66 116/83 66   Physical Exam  Constitutional: He is oriented to person, place, and time. He appears well-developed and well-nourished. No distress.  HENT:  Head: Normocephalic and atraumatic.  Right Ear: Hearing, external ear and ear canal normal. A middle ear effusion is present.  Left Ear: Hearing, tympanic membrane, external ear and ear canal normal.  Nose: Nose normal.  Mouth/Throat: Uvula is midline, oropharynx is clear and moist and mucous membranes are normal. No oropharyngeal exudate.  Eyes: Pupils are equal, round, and reactive to light. Conjunctivae and lids are normal. Right eye exhibits no discharge. Left eye exhibits no discharge. No scleral icterus. Right eye exhibits normal extraocular  motion. Left eye exhibits nystagmus. Left eye exhibits normal extraocular motion.  Cardiovascular: Normal rate, regular rhythm, normal heart sounds and intact distal pulses.  Exam reveals no gallop and no friction rub.   No murmur  heard. Pulmonary/Chest: Effort normal and breath sounds normal. No respiratory distress. He has no wheezes. He has no rales. He exhibits no tenderness.  Musculoskeletal: Normal range of motion.  Neurological: He is alert and oriented to person, place, and time.  Skin: Skin is warm, dry and intact. No rash noted. He is not diaphoretic. No erythema. No pallor.  Psychiatric: He has a normal mood and affect. His speech is normal and behavior is normal. Judgment and thought content normal. Cognition and memory are normal.  Nursing note and vitals reviewed.   Results for orders placed or performed in visit on 78/67/67  Basic metabolic panel  Result Value Ref Range   Glucose 80 65 - 99 mg/dL   BUN 11 6 - 20 mg/dL   Creatinine, Ser 0.79 0.76 - 1.27 mg/dL   GFR calc non Af Amer 117 >59 mL/min/1.73   GFR calc Af Amer 135 >59 mL/min/1.73   BUN/Creatinine Ratio 14 9 - 20   Sodium 139 134 - 144 mmol/L   Potassium 4.3 3.5 - 5.2 mmol/L   Chloride 99 96 - 106 mmol/L   CO2 24 18 - 29 mmol/L   Calcium 9.5 8.7 - 10.2 mg/dL  Uric acid  Result Value Ref Range   Uric Acid 5.5 3.7 - 8.6 mg/dL      Assessment & Plan:   Problem List Items Addressed This Visit      Genitourinary   Benign hypertensive renal disease - Primary    Under great control with weight loss. Not orthostatic. Continue current regimen. Continue to monitor. Recheck 6 months at physical.       Relevant Orders   CBC with Differential/Platelet   Comprehensive metabolic panel   Microalbumin, Urine Waived   TSH   UA/M w/rflx Culture, Routine     Other   Elevated cholesterol    Rechecking levels today. Await results. Call with any concerns.       Relevant Medications   lisinopril (PRINIVIL,ZESTRIL) 5 MG tablet   amLODipine (NORVASC) 10 MG tablet   Other Relevant Orders   Comprehensive metabolic panel   Lipid Panel w/o Chol/HDL Ratio   UA/M w/rflx Culture, Routine   Acute anxiety    Stable. Refill of lorazepam given. Rx  should last 1 year.       Relevant Medications   LORazepam (ATIVAN) 0.5 MG tablet    Other Visit Diagnoses    Vertigo       Not orthostatic, likely BPPV. Will treat with prednisone burst and referral to PT. Call with any concerns or if not getting better.    Relevant Orders   Comprehensive metabolic panel   TSH   UA/M w/rflx Culture, Routine       Follow up plan: Return in about 6 months (around 06/09/2017) for Physical.

## 2016-12-07 NOTE — Assessment & Plan Note (Signed)
Rechecking levels today. Await results. Call with any concerns.  

## 2016-12-07 NOTE — Assessment & Plan Note (Signed)
Stable. Refill of lorazepam given. Rx should last 1 year.

## 2016-12-07 NOTE — Assessment & Plan Note (Signed)
Under great control with weight loss. Not orthostatic. Continue current regimen. Continue to monitor. Recheck 6 months at physical.

## 2016-12-08 ENCOUNTER — Encounter: Payer: Self-pay | Admitting: Family Medicine

## 2016-12-08 LAB — COMPREHENSIVE METABOLIC PANEL
A/G RATIO: 2 (ref 1.2–2.2)
ALK PHOS: 63 IU/L (ref 39–117)
ALT: 33 IU/L (ref 0–44)
AST: 22 IU/L (ref 0–40)
Albumin: 4.5 g/dL (ref 3.5–5.5)
BILIRUBIN TOTAL: 0.7 mg/dL (ref 0.0–1.2)
BUN/Creatinine Ratio: 15 (ref 9–20)
BUN: 15 mg/dL (ref 6–20)
CHLORIDE: 101 mmol/L (ref 96–106)
CO2: 24 mmol/L (ref 20–29)
Calcium: 9.3 mg/dL (ref 8.7–10.2)
Creatinine, Ser: 0.97 mg/dL (ref 0.76–1.27)
GFR calc non Af Amer: 101 mL/min/{1.73_m2} (ref >59)
GFR, EST AFRICAN AMERICAN: 116 mL/min/{1.73_m2} (ref >59)
GLUCOSE: 91 mg/dL (ref 65–99)
Globulin, Total: 2.3 g/dL (ref 1.5–4.5)
POTASSIUM: 4.6 mmol/L (ref 3.5–5.2)
Sodium: 139 mmol/L (ref 134–144)
TOTAL PROTEIN: 6.8 g/dL (ref 6.0–8.5)

## 2016-12-08 LAB — CBC WITH DIFFERENTIAL/PLATELET
BASOS ABS: 0 10*3/uL (ref 0.0–0.2)
Basos: 0 %
EOS (ABSOLUTE): 0.2 10*3/uL (ref 0.0–0.4)
Eos: 3 %
Hematocrit: 46.3 % (ref 37.5–51.0)
Hemoglobin: 15.4 g/dL (ref 13.0–17.7)
IMMATURE GRANULOCYTES: 0 %
Immature Grans (Abs): 0 10*3/uL (ref 0.0–0.1)
LYMPHS: 26 %
Lymphocytes Absolute: 1.5 10*3/uL (ref 0.7–3.1)
MCH: 30.2 pg (ref 26.6–33.0)
MCHC: 33.3 g/dL (ref 31.5–35.7)
MCV: 91 fL (ref 79–97)
MONOS ABS: 0.4 10*3/uL (ref 0.1–0.9)
Monocytes: 7 %
NEUTROS ABS: 3.7 10*3/uL (ref 1.4–7.0)
NEUTROS PCT: 64 %
PLATELETS: 258 10*3/uL (ref 150–379)
RBC: 5.1 x10E6/uL (ref 4.14–5.80)
RDW: 13.9 % (ref 12.3–15.4)
WBC: 5.8 10*3/uL (ref 3.4–10.8)

## 2016-12-08 LAB — LIPID PANEL W/O CHOL/HDL RATIO
CHOLESTEROL TOTAL: 177 mg/dL (ref 100–199)
HDL: 40 mg/dL (ref >39)
LDL Calculated: 121 mg/dL — ABNORMAL HIGH (ref 0–99)
TRIGLYCERIDES: 81 mg/dL (ref 0–149)
VLDL CHOLESTEROL CAL: 16 mg/dL (ref 5–40)

## 2016-12-08 LAB — TSH: TSH: 1.09 u[IU]/mL (ref 0.450–4.500)

## 2016-12-12 LAB — UA/M W/RFLX CULTURE, ROUTINE
Bilirubin, UA: NEGATIVE
Glucose, UA: NEGATIVE
Ketones, UA: NEGATIVE
Leukocytes, UA: NEGATIVE
Nitrite, UA: NEGATIVE
Specific Gravity, UA: 1.02 (ref 1.005–1.030)
Urobilinogen, Ur: 0.2 mg/dL (ref 0.2–1.0)
pH, UA: 6 (ref 5.0–7.5)

## 2016-12-12 LAB — MICROALBUMIN, URINE WAIVED
CREATININE, URINE WAIVED: 200 mg/dL (ref 10–300)
MICROALB, UR WAIVED: 80 mg/L — AB (ref 0–19)

## 2016-12-12 LAB — MICROSCOPIC EXAMINATION: BACTERIA UA: NONE SEEN

## 2017-01-10 ENCOUNTER — Other Ambulatory Visit: Payer: Self-pay | Admitting: Family Medicine

## 2017-01-23 ENCOUNTER — Telehealth: Payer: Self-pay | Admitting: Family Medicine

## 2017-01-23 NOTE — Telephone Encounter (Signed)
Patient notified that he should have prescriptions on hold at [facility].

## 2017-05-31 ENCOUNTER — Encounter: Payer: Self-pay | Admitting: Family Medicine

## 2017-05-31 ENCOUNTER — Ambulatory Visit: Payer: BLUE CROSS/BLUE SHIELD | Admitting: Family Medicine

## 2017-05-31 VITALS — BP 124/84 | HR 90 | Temp 98.5°F | Wt 243.2 lb

## 2017-05-31 DIAGNOSIS — J069 Acute upper respiratory infection, unspecified: Secondary | ICD-10-CM

## 2017-05-31 DIAGNOSIS — J101 Influenza due to other identified influenza virus with other respiratory manifestations: Secondary | ICD-10-CM

## 2017-05-31 LAB — VERITOR FLU A/B WAIVED
INFLUENZA A: POSITIVE — AB
INFLUENZA B: NEGATIVE

## 2017-05-31 MED ORDER — OSELTAMIVIR PHOSPHATE 75 MG PO CAPS
75.0000 mg | ORAL_CAPSULE | Freq: Two times a day (BID) | ORAL | 0 refills | Status: DC
Start: 2017-05-31 — End: 2017-06-12

## 2017-05-31 NOTE — Progress Notes (Signed)
BP 124/84 (BP Location: Left Arm, Patient Position: Sitting, Cuff Size: Large)   Pulse 90   Temp 98.5 F (36.9 C)   Wt 243 lb 3 oz (110.3 kg)   SpO2 97%   BMI 31.56 kg/m    Subjective:    [name], male    DOB: [birth year], 37 y.o.   MRN: [id number]  HPI: Gordon Carson is a 37 y.o. male  Chief Complaint  Patient presents with  . URI   UPPER RESPIRATORY TRACT INFECTION Duration:  Started yesterday Worst symptom: cough and headache Fever: yes Cough: yes Shortness of breath: yes Wheezing: no Chest pain: no Chest tightness: yes Chest congestion: yes Nasal congestion: no Runny nose: no Post nasal drip: no Sneezing: no Sore throat: no Swollen glands: no Sinus pressure: no Headache: yes Face pain: no Toothache: no Ear pain: no  Ear pressure: no  Eyes red/itching:yes Eye drainage/crusting: yes  Vomiting: no Rash: no Fatigue: yes Sick contacts: yes Strep contacts: no  Context: better Recurrent sinusitis: no Relief with OTC cold/cough medications: no  Treatments attempted: tylenol    Relevant past medical, surgical, family and social history reviewed and updated as indicated. Interim medical history since our last visit reviewed. Allergies and medications reviewed and updated.  Review of Systems  Constitutional: Negative.   HENT: Positive for congestion, postnasal drip, sneezing and sore throat. Negative for dental problem, drooling, ear discharge, ear pain, facial swelling, hearing loss, mouth sores, nosebleeds, rhinorrhea, sinus pressure, sinus pain, tinnitus, trouble swallowing and voice change.   Respiratory: Positive for cough, shortness of breath and wheezing. Negative for apnea, choking, chest tightness and stridor.   Cardiovascular: Negative.   Psychiatric/Behavioral: Negative.     Per HPI unless specifically indicated above     Objective:    BP 124/84 (BP Location: Left Arm, Patient Position: Sitting, Cuff Size: Large)    Pulse 90   Temp 98.5 F (36.9 C)   Wt 243 lb 3 oz (110.3 kg)   SpO2 97%   BMI 31.56 kg/m   Wt Readings from Last 3 Encounters:  05/31/17 243 lb 3 oz (110.3 kg)  12/07/16 237 lb 3 oz (107.6 kg)  06/21/16 251 lb (113.9 kg)    Physical Exam  Constitutional: He is oriented to person, place, and time. He appears well-developed and well-nourished. No distress.  HENT:  Head: Normocephalic and atraumatic.  Right Ear: Hearing and external ear normal.  Left Ear: Hearing and external ear normal.  Nose: Nose normal.  Mouth/Throat: Oropharynx is clear and moist. No oropharyngeal exudate.  Eyes: Conjunctivae, EOM and lids are normal. Pupils are equal, round, and reactive to light. Right eye exhibits no discharge. Left eye exhibits no discharge. No scleral icterus.  Neck: Normal range of motion. Neck supple. No JVD present. No tracheal deviation present. No thyromegaly present.  Cardiovascular: Normal rate, regular rhythm, normal heart sounds and intact distal pulses. Exam reveals no gallop and no friction rub.  No murmur heard. Pulmonary/Chest: Effort normal and breath sounds normal. No stridor. No respiratory distress. He has no wheezes. He has no rales. He exhibits no tenderness.  Musculoskeletal: Normal range of motion.  Lymphadenopathy:    He has no cervical adenopathy.  Neurological: He is alert and oriented to person, place, and time.  Skin: Skin is warm, dry and intact. No rash noted. He is not diaphoretic. No erythema. No pallor.  Psychiatric: He has a normal mood and affect. His speech is normal and behavior  is normal. Judgment and thought content normal. Cognition and memory are normal.    Results for orders placed or performed in visit on 12/07/16  Microscopic Examination  Result Value Ref Range   WBC, UA 0-5 0 - 5 /hpf   RBC, UA 0-2 0 - 2 /hpf   Epithelial Cells (non renal) CANCELED    Bacteria, UA None seen None seen/Few  CBC with Differential/Platelet  Result Value Ref  Range   WBC 5.8 3.4 - 10.8 x10E3/uL   RBC 5.10 4.14 - 5.80 x10E6/uL   Hemoglobin 15.4 13.0 - 17.7 g/dL   Hematocrit 46.3 37.5 - 51.0 %   MCV 91 79 - 97 fL   MCH 30.2 26.6 - 33.0 pg   MCHC 33.3 31.5 - 35.7 g/dL   RDW 13.9 12.3 - 15.4 %   Platelets 258 150 - 379 x10E3/uL   Neutrophils 64 Not Estab. %   Lymphs 26 Not Estab. %   Monocytes 7 Not Estab. %   Eos 3 Not Estab. %   Basos 0 Not Estab. %   Neutrophils Absolute 3.7 1.4 - 7.0 x10E3/uL   Lymphocytes Absolute 1.5 0.7 - 3.1 x10E3/uL   Monocytes Absolute 0.4 0.1 - 0.9 x10E3/uL   EOS (ABSOLUTE) 0.2 0.0 - 0.4 x10E3/uL   Basophils Absolute 0.0 0.0 - 0.2 x10E3/uL   Immature Granulocytes 0 Not Estab. %   Immature Grans (Abs) 0.0 0.0 - 0.1 x10E3/uL  Comprehensive metabolic panel  Result Value Ref Range   Glucose 91 65 - 99 mg/dL   BUN 15 6 - 20 mg/dL   Creatinine, Ser 0.97 0.76 - 1.27 mg/dL   GFR calc non Af Amer 101 >59 mL/min/1.73   GFR calc Af Amer 116 >59 mL/min/1.73   BUN/Creatinine Ratio 15 9 - 20   Sodium 139 134 - 144 mmol/L   Potassium 4.6 3.5 - 5.2 mmol/L   Chloride 101 96 - 106 mmol/L   CO2 24 20 - 29 mmol/L   Calcium 9.3 8.7 - 10.2 mg/dL   Total Protein 6.8 6.0 - 8.5 g/dL   Albumin 4.5 3.5 - 5.5 g/dL   Globulin, Total 2.3 1.5 - 4.5 g/dL   Albumin/Globulin Ratio 2.0 1.2 - 2.2   Bilirubin Total 0.7 0.0 - 1.2 mg/dL   Alkaline Phosphatase 63 39 - 117 IU/L   AST 22 0 - 40 IU/L   ALT 33 0 - 44 IU/L  Lipid Panel w/o Chol/HDL Ratio  Result Value Ref Range   Cholesterol, Total 177 100 - 199 mg/dL   Triglycerides 81 0 - 149 mg/dL   HDL 40 >39 mg/dL   VLDL Cholesterol Cal 16 5 - 40 mg/dL   LDL Calculated 121 (H) 0 - 99 mg/dL  Microalbumin, Urine Waived  Result Value Ref Range   Microalb, Ur Waived 80 (H) 0 - 19 mg/L   Creatinine, Urine Waived 200 10 - 300 mg/dL   Microalb/Creat Ratio 30-300 (H) <30 mg/g  TSH  Result Value Ref Range   TSH 1.090 0.450 - 4.500 uIU/mL  UA/M w/rflx Culture, Routine  Result Value Ref  Range   Specific Gravity, UA 1.020 1.005 - 1.030   pH, UA 6.0 5.0 - 7.5   Color, UA Yellow Yellow   Appearance Ur Clear Clear   Leukocytes, UA Negative Negative   Protein, UA 1+ (A) Negative/Trace   Glucose, UA Negative Negative   Ketones, UA Negative Negative   RBC, UA Trace (A) Negative   Bilirubin, UA Negative  Negative   Urobilinogen, Ur 0.2 0.2 - 1.0 mg/dL   Nitrite, UA Negative Negative   Microscopic Examination See below:       Assessment & Plan:   Problem List Items Addressed This Visit    None    Visit Diagnoses    Influenza A    -  Primary   Will treat with tamiflu. Rest. Call if not getting better or getting worse.    Relevant Medications   oseltamivir (TAMIFLU) 75 MG capsule   Upper respiratory tract infection, unspecified type       + flu   Relevant Medications   oseltamivir (TAMIFLU) 75 MG capsule   Other Relevant Orders   Veritor Flu A/B Waived       Follow up plan: Return if symptoms worsen or fail to improve.

## 2017-06-01 ENCOUNTER — Ambulatory Visit: Payer: BLUE CROSS/BLUE SHIELD | Admitting: Family Medicine

## 2017-06-12 ENCOUNTER — Ambulatory Visit (INDEPENDENT_AMBULATORY_CARE_PROVIDER_SITE_OTHER): Payer: BLUE CROSS/BLUE SHIELD | Admitting: Family Medicine

## 2017-06-12 ENCOUNTER — Encounter: Payer: Self-pay | Admitting: Family Medicine

## 2017-06-12 VITALS — BP 117/70 | HR 69 | Temp 98.4°F | Ht 72.3 in | Wt 241.4 lb

## 2017-06-12 DIAGNOSIS — I129 Hypertensive chronic kidney disease with stage 1 through stage 4 chronic kidney disease, or unspecified chronic kidney disease: Secondary | ICD-10-CM | POA: Diagnosis not present

## 2017-06-12 DIAGNOSIS — Z0001 Encounter for general adult medical examination with abnormal findings: Secondary | ICD-10-CM | POA: Diagnosis not present

## 2017-06-12 DIAGNOSIS — E7800 Pure hypercholesterolemia, unspecified: Secondary | ICD-10-CM | POA: Diagnosis not present

## 2017-06-12 DIAGNOSIS — Z23 Encounter for immunization: Secondary | ICD-10-CM | POA: Diagnosis not present

## 2017-06-12 DIAGNOSIS — Z0000 Encounter for general adult medical examination without abnormal findings: Secondary | ICD-10-CM

## 2017-06-12 MED ORDER — LISINOPRIL 5 MG PO TABS: 5.0000 mg | ORAL_TABLET | Freq: Every day | ORAL | 3 refills | Status: DC

## 2017-06-12 MED ORDER — AMLODIPINE BESYLATE 10 MG PO TABS: ORAL_TABLET | ORAL | 3 refills | Status: DC

## 2017-06-12 NOTE — Assessment & Plan Note (Signed)
Rechecking levels today. Not on any medicine. Call with any concerns.

## 2017-06-12 NOTE — Progress Notes (Signed)
BP 117/70 (BP Location: Left Arm, Patient Position: Sitting, Cuff Size: Normal)   Pulse 69   Temp 98.4 F (36.9 C)   Ht 6' 0.3" (1.836 m)   Wt 241 lb 6 oz (109.5 kg)   SpO2 98%   BMI 32.47 kg/m    Subjective:    [name], male    DOB: Jun 17, 1980, 37 y.o.   MRN: [id number]  HPI: Gordon Carson is a 37 y.o. male presenting on 06/12/2017 for comprehensive medical examination. Current medical complaints include:  HYPERTENSION / HYPERLIPIDEMIA Satisfied with current treatment? yes Duration of hypertension: chronic BP monitoring frequency: not checking BP medication side effects: no Past BP meds: amlodipine, lisinopril Duration of hyperlipidemia: chronic Cholesterol medication side effects: Not on anything Cholesterol supplements: none Past cholesterol medications: None Medication compliance: excellent compliance Aspirin: yes Recent stressors: no Recurrent headaches: no Visual changes: no Palpitations: yes Dyspnea: no Chest pain: no Lower extremity edema: no Dizzy/lightheaded: no  He currently lives with: wife and daughter- another daughter on the way Interim Problems from his last visit: no  Depression Screen done today and results listed below:  Depression screen [facility] 2/9 12/07/2016  Decreased Interest 0  Down, Depressed, Hopeless 0  PHQ - 2 Score 0  Altered sleeping 0  Tired, decreased energy 0  Change in appetite 0  Feeling bad or failure about yourself  0  Trouble concentrating 0  Moving slowly or fidgety/restless 0  Suicidal thoughts 0  PHQ-9 Score 0    Past Medical History:  Past Medical History:  Diagnosis Date  . Anxiety   . Hypertension   . Slipped epiphysis     Surgical History:  Past Surgical History:  Procedure Laterality Date  . APPENDECTOMY  2004  . HIP SURGERY  1998   pin placed    Medications:  No current outpatient medications on file prior to visit.   No current facility-administered medications on file  prior to visit.     Allergies:  Allergies  Allergen Reactions  . Codeine Nausea And Vomiting    Social History:  Social History   Socioeconomic History  . Marital status: Married    Spouse name: Not on file  . Number of children: Not on file  . Years of education: Not on file  . Highest education level: Not on file  Social Needs  . Financial resource strain: Not on file  . Food insecurity - worry: Not on file  . Food insecurity - inability: Not on file  . Transportation needs - medical: Not on file  . Transportation needs - non-medical: Not on file  Occupational History  . Not on file  Tobacco Use  . Smoking status: Never Smoker  . Smokeless tobacco: Never Used  Substance and Sexual Activity  . Alcohol use: No    Alcohol/week: 0.0 oz  . Drug use: No  . Sexual activity: Yes  Other Topics Concern  . Not on file  Social History Narrative  . Not on file   Social History   Tobacco Use  Smoking Status Never Smoker  Smokeless Tobacco Never Used   Social History   Substance and Sexual Activity  Alcohol Use No  . Alcohol/week: 0.0 oz    Family History:  Family History  Problem Relation Age of Onset  . Cancer Mother        breast  . Hypertension Maternal Grandfather   . Stroke Maternal Grandfather   . Heart disease Maternal Grandfather  Past medical history, surgical history, medications, allergies, family history and social history reviewed with patient today and changes made to appropriate areas of the chart.   Review of Systems  Constitutional: Negative.   HENT: Negative.   Eyes: Negative.   Respiratory: Negative.   Cardiovascular: Negative.   Gastrointestinal: Positive for heartburn (with what he eats). Negative for abdominal pain, blood in stool, constipation, diarrhea, melena, nausea and vomiting.  Genitourinary: Negative.   Musculoskeletal: Negative.   Skin: Negative.   Neurological: Negative.   Endo/Heme/Allergies: Negative.     Psychiatric/Behavioral: Negative.     All other ROS negative except what is listed above and in the HPI.      Objective:    BP 117/70 (BP Location: Left Arm, Patient Position: Sitting, Cuff Size: Normal)   Pulse 69   Temp 98.4 F (36.9 C)   Ht 6' 0.3" (1.836 m)   Wt 241 lb 6 oz (109.5 kg)   SpO2 98%   BMI 32.47 kg/m   Wt Readings from Last 3 Encounters:  06/12/17 241 lb 6 oz (109.5 kg)  05/31/17 243 lb 3 oz (110.3 kg)  12/07/16 237 lb 3 oz (107.6 kg)    Physical Exam  Constitutional: He is oriented to person, place, and time. He appears well-developed and well-nourished. No distress.  HENT:  Head: Normocephalic and atraumatic.  Right Ear: Hearing, tympanic membrane, external ear and ear canal normal.  Left Ear: Hearing, tympanic membrane, external ear and ear canal normal.  Nose: Nose normal.  Mouth/Throat: Uvula is midline, oropharynx is clear and moist and mucous membranes are normal. No oropharyngeal exudate.  Eyes: Conjunctivae, EOM and lids are normal. Pupils are equal, round, and reactive to light. Right eye exhibits no discharge. Left eye exhibits no discharge. No scleral icterus.  Neck: Normal range of motion. Neck supple. No JVD present. No tracheal deviation present. No thyromegaly present.  Cardiovascular: Normal rate, regular rhythm, normal heart sounds and intact distal pulses. Exam reveals no gallop and no friction rub.  No murmur heard. Pulmonary/Chest: Effort normal and breath sounds normal. No stridor. No respiratory distress. He has no wheezes. He has no rales. He exhibits no tenderness.  Abdominal: Soft. Bowel sounds are normal. He exhibits no distension and no mass. There is no tenderness. There is no rebound and no guarding.  Genitourinary: Penis normal. No penile tenderness.  Musculoskeletal: Normal range of motion. He exhibits no edema, tenderness or deformity.  Lymphadenopathy:    He has no cervical adenopathy.  Neurological: He is alert and oriented  to person, place, and time. He has normal reflexes. He displays normal reflexes. No cranial nerve deficit. He exhibits normal muscle tone. Coordination normal.  Skin: Skin is warm, dry and intact. No rash noted. He is not diaphoretic. No erythema. No pallor.  Psychiatric: He has a normal mood and affect. His speech is normal and behavior is normal. Judgment and thought content normal. Cognition and memory are normal.  Nursing note and vitals reviewed.   Results for orders placed or performed in visit on 05/31/17  Veritor Flu A/B Waived  Result Value Ref Range   Influenza A Positive (A) Negative   Influenza B Negative Negative      Assessment & Plan:   Problem List Items Addressed This Visit      Genitourinary   Benign hypertensive renal disease    Under good control. Continue current regimen. Call with any concerns. Refills given.       Relevant Orders  Comprehensive metabolic panel   Microalbumin, Urine Waived     Other   Elevated cholesterol    Rechecking levels today. Not on any medicine. Call with any concerns.       Relevant Medications   lisinopril (PRINIVIL,ZESTRIL) 5 MG tablet   amLODipine (NORVASC) 10 MG tablet   Other Relevant Orders   Comprehensive metabolic panel   Lipid Panel w/o Chol/HDL Ratio    Other Visit Diagnoses    Routine general medical examination at a health care facility    -  Primary   Vaccines up to date. Screening labs checked today. Continue diet and exercise. Call with any concerns.    Relevant Orders   CBC with Differential/Platelet   Comprehensive metabolic panel   Lipid Panel w/o Chol/HDL Ratio   Microalbumin, Urine Waived   TSH   UA/M w/rflx Culture, Routine   Immunization due       Flu vaccine given today.    Relevant Orders   Flu Vaccine QUAD 6+ mos PF IM (Fluarix Quad PF) (Completed)     LABORATORY TESTING:  Health maintenance labs ordered today as discussed above.   IMMUNIZATIONS:   - Tdap: Tetanus vaccination status  reviewed: last tetanus booster within 10 years. - Influenza: Administered today  PATIENT COUNSELING:    Sexuality: Discussed sexually transmitted diseases, partner selection, use of condoms, avoidance of unintended pregnancy  and contraceptive alternatives.   Advised to avoid cigarette smoking.  I discussed with the patient that most people either abstain from alcohol or drink within safe limits (<=14/week and <=4 drinks/occasion for males, <=7/weeks and <= 3 drinks/occasion for females) and that the risk for alcohol disorders and other health effects rises proportionally with the number of drinks per week and how often a drinker exceeds daily limits.  Discussed cessation/primary prevention of drug use and availability of treatment for abuse.   Diet: Encouraged to adjust caloric intake to maintain  or achieve ideal body weight, to reduce intake of dietary saturated fat and total fat, to limit sodium intake by avoiding high sodium foods and not adding table salt, and to maintain adequate dietary potassium and calcium preferably from fresh fruits, vegetables, and low-fat dairy products.    stressed the importance of regular exercise  Injury prevention: Discussed safety belts, safety helmets, smoke detector, smoking near bedding or upholstery.   Dental health: Discussed importance of regular tooth brushing, flossing, and dental visits.   Follow up plan: NEXT PREVENTATIVE PHYSICAL DUE IN 1 YEAR. Return in about 1 year (around 06/12/2018) for Physical.

## 2017-06-12 NOTE — Assessment & Plan Note (Signed)
Under good control. Continue current regimen. Call with any concerns. Refills given.  

## 2017-06-12 NOTE — Patient Instructions (Addendum)
Influenza (Flu) Vaccine (Inactivated or Recombinant): What You Need to Know 1. Why get vaccinated? Influenza ("flu") is a contagious disease that spreads around the [place] every year, usually between October and May. Flu is caused by influenza viruses, and is spread mainly by coughing, sneezing, and close contact. Anyone can get flu. Flu strikes suddenly and can last several days. Symptoms vary by age, but can include:  fever/chills  sore throat  muscle aches  fatigue  cough  headache  runny or stuffy nose  Flu can also lead to pneumonia and blood infections, and cause diarrhea and seizures in children. If you have a medical condition, such as heart or lung disease, flu can make it worse. Flu is more dangerous for some people. Infants and young children, people 23 years of age and older, pregnant women, and people with certain health conditions or a weakened immune system are at greatest risk. Each year thousands of people in the [place] die from flu, and many more are hospitalized. Flu vaccine can:  keep you from getting flu,  make flu less severe if you do get it, and  keep you from spreading flu to your family and other people. 2. Inactivated and recombinant flu vaccines A dose of flu vaccine is recommended every flu season. Children 6 months through 91 years of age may need two doses during the same flu season. Everyone else needs only one dose each flu season. Some inactivated flu vaccines contain a very small amount of a mercury-based preservative called thimerosal. Studies have not shown thimerosal in vaccines to be harmful, but flu vaccines that do not contain thimerosal are available. There is no live flu virus in flu shots. They cannot cause the flu. There are many flu viruses, and they are always changing. Each year a new flu vaccine is made to protect against three or four viruses that are likely to cause disease in the upcoming flu season. But even when the  vaccine doesn't exactly match these viruses, it may still provide some protection. Flu vaccine cannot prevent:  flu that is caused by a virus not covered by the vaccine, or  illnesses that look like flu but are not.  It takes about 2 weeks for protection to develop after vaccination, and protection lasts through the flu season. 3. Some people should not get this vaccine Tell the person who is giving you the vaccine:  If you have any severe, life-threatening allergies. If you ever had a life-threatening allergic reaction after a dose of flu vaccine, or have a severe allergy to any part of this vaccine, you may be advised not to get vaccinated. Most, but not all, types of flu vaccine contain a small amount of egg protein.  If you ever had Guillain-Barr Syndrome (also called GBS). Some people with a history of GBS should not get this vaccine. This should be discussed with your doctor.  If you are not feeling well. It is usually okay to get flu vaccine when you have a mild illness, but you might be asked to come back when you feel better.  4. Risks of a vaccine reaction With any medicine, including vaccines, there is a chance of reactions. These are usually mild and go away on their own, but serious reactions are also possible. Most people who get a flu shot do not have any problems with it. Minor problems following a flu shot include:  soreness, redness, or swelling where the shot was given  hoarseness  sore,  red or itchy eyes  cough  fever  aches  headache  itching  fatigue  If these problems occur, they usually begin soon after the shot and last 1 or 2 days. More serious problems following a flu shot can include the following:  There may be a small increased risk of Guillain-Barre Syndrome (GBS) after inactivated flu vaccine. This risk has been estimated at 1 or 2 additional cases per million people vaccinated. This is much lower than the risk of severe complications from  flu, which can be prevented by flu vaccine.  Young children who get the flu shot along with pneumococcal vaccine (PCV13) and/or DTaP vaccine at the same time might be slightly more likely to have a seizure caused by fever. Ask your doctor for more information. Tell your doctor if a child who is getting flu vaccine has ever had a seizure.  Problems that could happen after any injected vaccine:  People sometimes faint after a medical procedure, including vaccination. Sitting or lying down for about 15 minutes can help prevent fainting, and injuries caused by a fall. Tell your doctor if you feel dizzy, or have vision changes or ringing in the ears.  Some people get severe pain in the shoulder and have difficulty moving the arm where a shot was given. This happens very rarely.  Any medication can cause a severe allergic reaction. Such reactions from a vaccine are very rare, estimated at about 1 in a million doses, and would happen within a few minutes to a few hours after the vaccination. As with any medicine, there is a very remote chance of a vaccine causing a serious injury or death. The safety of vaccines is always being monitored. For more information, visit: http://www.aguilar.org/ 5. What if there is a serious reaction? What should I look for? Look for anything that concerns you, such as signs of a severe allergic reaction, very high fever, or unusual behavior. Signs of a severe allergic reaction can include hives, swelling of the face and throat, difficulty breathing, a fast heartbeat, dizziness, and weakness. These would start a few minutes to a few hours after the vaccination. What should I do?  If you think it is a severe allergic reaction or other emergency that can't wait, call 9-1-1 and get the person to the nearest hospital. Otherwise, call your doctor.  Reactions should be reported to the Vaccine Adverse Event Reporting System (VAERS). Your doctor should file this report, or you  can do it yourself through the VAERS web site at www.vaers.SamedayNews.es, or by calling 6094730752. ? VAERS does not give medical advice. 6. The National Vaccine Injury Compensation Program The [employer] Vaccine Injury Compensation Program (VICP) is a federal program that was created to compensate people who may have been injured by certain vaccines. Persons who believe they may have been injured by a vaccine can learn about the program and about filing a claim by calling 458-267-6070 or visiting the [place] website at GoldCloset.com.ee. There is a time limit to file a claim for compensation. 7. How can I learn more?  Ask your healthcare provider. He or she can give you the vaccine package insert or suggest other sources of information.  Call your local or state health department.  Contact the Centers for Disease Control and Prevention (CDC): ? Call (540)164-9661 (1-800-CDC-INFO) or ? Visit CDC's website at https://gibson.com/ Vaccine Information Statement, Inactivated Influenza Vaccine (12/19/2013) This information is not intended to replace advice given to you by your health care provider. Make sure  you discuss any questions you have with your health care provider. Document Released: 02/23/2006 Document Revised: 01/20/2016 Document Reviewed: 01/20/2016 Elsevier Interactive Patient Education  2017 Elsevier Inc.  Health Maintenance, Male A healthy lifestyle and preventive care is important for your health and wellness. Ask your health care provider about what schedule of regular examinations is right for you. What should I know about weight and diet? Eat a Healthy Diet  Eat plenty of vegetables, fruits, whole grains, low-fat dairy products, and lean protein.  Do not eat a lot of foods high in solid fats, added sugars, or salt.  Maintain a Healthy Weight Regular exercise can help you achieve or maintain a healthy weight. You should:  Do at least 150 minutes of exercise each  week. The exercise should increase your heart rate and make you sweat (moderate-intensity exercise).  Do strength-training exercises at least twice a week.  Watch Your Levels of Cholesterol and Blood Lipids  Have your blood tested for lipids and cholesterol every 5 years starting at 35 years of age. If you are at high risk for heart disease, you should start having your blood tested when you are 36 years old. You may need to have your cholesterol levels checked more often if: ? Your lipid or cholesterol levels are high. ? You are older than 36 years of age. ? You are at high risk for heart disease.  What should I know about cancer screening? Many types of cancers can be detected early and may often be prevented. Lung Cancer  You should be screened every year for lung cancer if: ? You are a current smoker who has smoked for at least 30 years. ? You are a former smoker who has quit within the past 15 years.  Talk to your health care provider about your screening options, when you should start screening, and how often you should be screened.  Colorectal Cancer  Routine colorectal cancer screening usually begins at 36 years of age and should be repeated every 5-10 years until you are 36 years old. You may need to be screened more often if early forms of precancerous polyps or small growths are found. Your health care provider may recommend screening at an earlier age if you have risk factors for colon cancer.  Your health care provider may recommend using home test kits to check for hidden blood in the stool.  A small camera at the end of a tube can be used to examine your colon (sigmoidoscopy or colonoscopy). This checks for the earliest forms of colorectal cancer.  Prostate and Testicular Cancer  Depending on your age and overall health, your health care provider may do certain tests to screen for prostate and testicular cancer.  Talk to your health care provider about any symptoms or  concerns you have about testicular or prostate cancer.  Skin Cancer  Check your skin from head to toe regularly.  Tell your health care provider about any new moles or changes in moles, especially if: ? There is a change in a mole's size, shape, or color. ? You have a mole that is larger than a pencil eraser.  Always use sunscreen. Apply sunscreen liberally and repeat throughout the day.  Protect yourself by wearing long sleeves, pants, a wide-brimmed hat, and sunglasses when outside.  What should I know about heart disease, diabetes, and high blood pressure?  If you are 18-39 years of age, have your blood pressure checked every 3-5 years. If you are 40 years   of age or older, have your blood pressure checked every year. You should have your blood pressure measured twice-once when you are at a hospital or clinic, and once when you are not at a hospital or clinic. Record the average of the two measurements. To check your blood pressure when you are not at a hospital or clinic, you can use: ? An automated blood pressure machine at a pharmacy. ? A home blood pressure monitor.  Talk to your health care provider about your target blood pressure.  If you are between 45-79 years old, ask your health care provider if you should take aspirin to prevent heart disease.  Have regular diabetes screenings by checking your fasting blood sugar level. ? If you are at a normal weight and have a low risk for diabetes, have this test once every three years after the age of 45. ? If you are overweight and have a high risk for diabetes, consider being tested at a younger age or more often.  A one-time screening for abdominal aortic aneurysm (AAA) by ultrasound is recommended for men aged 65-75 years who are current or former smokers. What should I know about preventing infection? Hepatitis B If you have a higher risk for hepatitis B, you should be screened for this virus. Talk with your health care provider  to find out if you are at risk for hepatitis B infection. Hepatitis C Blood testing is recommended for:  Everyone born from 1945 through 1965.  Anyone with known risk factors for hepatitis C.  Sexually Transmitted Diseases (STDs)  You should be screened each year for STDs including gonorrhea and chlamydia if: ? You are sexually active and are younger than 36 years of age. ? You are older than 36 years of age and your health care provider tells you that you are at risk for this type of infection. ? Your sexual activity has changed since you were last screened and you are at an increased risk for chlamydia or gonorrhea. Ask your health care provider if you are at risk.  Talk with your health care provider about whether you are at high risk of being infected with HIV. Your health care provider may recommend a prescription medicine to help prevent HIV infection.  What else can I do?  Schedule regular health, dental, and eye exams.  Stay current with your vaccines (immunizations).  Do not use any tobacco products, such as cigarettes, chewing tobacco, and e-cigarettes. If you need help quitting, ask your health care provider.  Limit alcohol intake to no more than 2 drinks per day. One drink equals 12 ounces of beer, 5 ounces of wine, or 1 ounces of hard liquor.  Do not use street drugs.  Do not share needles.  Ask your health care provider for help if you need support or information about quitting drugs.  Tell your health care provider if you often feel depressed.  Tell your health care provider if you have ever been abused or do not feel safe at home. This information is not intended to replace advice given to you by your health care provider. Make sure you discuss any questions you have with your health care provider. Document Released: 10/28/2007 Document Revised: 12/29/2015 Document Reviewed: 02/02/2015 Elsevier Interactive Patient Education  2018 Elsevier Inc.  

## 2017-06-13 LAB — COMPREHENSIVE METABOLIC PANEL
A/G RATIO: 1.7 (ref 1.2–2.2)
ALBUMIN: 4.8 g/dL (ref 3.5–5.5)
ALT: 29 IU/L (ref 0–44)
AST: 18 IU/L (ref 0–40)
Alkaline Phosphatase: 64 IU/L (ref 39–117)
BUN / CREAT RATIO: 12 (ref 9–20)
BUN: 12 mg/dL (ref 6–20)
Bilirubin Total: 0.8 mg/dL (ref 0.0–1.2)
CALCIUM: 9.8 mg/dL (ref 8.7–10.2)
CO2: 24 mmol/L (ref 20–29)
CREATININE: 0.98 mg/dL (ref 0.76–1.27)
Chloride: 99 mmol/L (ref 96–106)
GFR, EST AFRICAN AMERICAN: 115 mL/min/{1.73_m2} (ref >59)
GFR, EST NON AFRICAN AMERICAN: 99 mL/min/{1.73_m2} (ref >59)
GLOBULIN, TOTAL: 2.8 g/dL (ref 1.5–4.5)
Glucose: 82 mg/dL (ref 65–99)
Potassium: 4.9 mmol/L (ref 3.5–5.2)
SODIUM: 140 mmol/L (ref 134–144)
Total Protein: 7.6 g/dL (ref 6.0–8.5)

## 2017-06-13 LAB — CBC WITH DIFFERENTIAL/PLATELET
BASOS: 1 %
Basophils Absolute: 0 10*3/uL (ref 0.0–0.2)
EOS (ABSOLUTE): 0.2 10*3/uL (ref 0.0–0.4)
EOS: 4 %
HEMATOCRIT: 47.4 % (ref 37.5–51.0)
HEMOGLOBIN: 16.5 g/dL (ref 13.0–17.7)
Immature Grans (Abs): 0 10*3/uL (ref 0.0–0.1)
Immature Granulocytes: 1 %
Lymphocytes Absolute: 1.9 10*3/uL (ref 0.7–3.1)
Lymphs: 29 %
MCH: 31.3 pg (ref 26.6–33.0)
MCHC: 34.8 g/dL (ref 31.5–35.7)
MCV: 90 fL (ref 79–97)
MONOCYTES: 8 %
MONOS ABS: 0.5 10*3/uL (ref 0.1–0.9)
Neutrophils Absolute: 3.9 10*3/uL (ref 1.4–7.0)
Neutrophils: 57 %
Platelets: 308 10*3/uL (ref 150–379)
RBC: 5.28 x10E6/uL (ref 4.14–5.80)
RDW: 13.5 % (ref 12.3–15.4)
WBC: 6.6 10*3/uL (ref 3.4–10.8)

## 2017-06-13 LAB — LIPID PANEL W/O CHOL/HDL RATIO
Cholesterol, Total: 198 mg/dL (ref 100–199)
HDL: 39 mg/dL — ABNORMAL LOW (ref >39)
LDL CALC: 131 mg/dL — AB (ref 0–99)
TRIGLYCERIDES: 139 mg/dL (ref 0–149)
VLDL Cholesterol Cal: 28 mg/dL (ref 5–40)

## 2017-06-13 LAB — TSH: TSH: 1.29 u[IU]/mL (ref 0.450–4.500)

## 2017-06-14 LAB — MICROALBUMIN, URINE WAIVED
Creatinine, Urine Waived: 300 mg/dL (ref 10–300)
Microalb, Ur Waived: 30 mg/L — ABNORMAL HIGH (ref 0–19)
Microalb/Creat Ratio: <30 mg/g (ref <30)

## 2017-06-14 LAB — UA/M W/RFLX CULTURE, ROUTINE
Bilirubin, UA: NEGATIVE
Glucose, UA: NEGATIVE
Ketones, UA: NEGATIVE
NITRITE UA: NEGATIVE
PH UA: 6 (ref 5.0–7.5)
Protein, UA: NEGATIVE
Specific Gravity, UA: 1.015 (ref 1.005–1.030)
Urobilinogen, Ur: 0.2 mg/dL (ref 0.2–1.0)

## 2017-06-14 LAB — MICROSCOPIC EXAMINATION

## 2017-06-14 LAB — URINE CULTURE, REFLEX: ORGANISM ID, BACTERIA: NO GROWTH

## 2017-07-28 ENCOUNTER — Other Ambulatory Visit: Payer: Self-pay | Admitting: Family Medicine

## 2017-09-18 ENCOUNTER — Telehealth: Payer: Self-pay

## 2017-09-18 DIAGNOSIS — Z23 Encounter for immunization: Secondary | ICD-10-CM

## 2017-09-18 NOTE — Telephone Encounter (Signed)
Copied from [place] [phone] [id number]. Topic: General - Other >> Sep 18, 2017 11:00 AM Yvette Rack wrote: Reason for CRM: patient wife is getting ready to have a baby and need to up date his TDAP please put orders in pt has an appt on Friday  >> Sep 18, 2017 11:02 AM Yvette Rack wrote: Pt appt is on Thursday

## 2017-09-20 ENCOUNTER — Ambulatory Visit (INDEPENDENT_AMBULATORY_CARE_PROVIDER_SITE_OTHER): Payer: BLUE CROSS/BLUE SHIELD

## 2017-09-20 DIAGNOSIS — Z23 Encounter for immunization: Secondary | ICD-10-CM

## 2018-01-23 ENCOUNTER — Telehealth: Payer: Self-pay | Admitting: Family Medicine

## 2018-01-23 NOTE — Telephone Encounter (Signed)
Copied from [place] [phone]. Topic: General - Other >> Jan 23, 2018 12:54 PM Lennox Solders wrote: Reason for CRM: pt is calling his daughter was exposure to parapertussis. Pt daughter is being treating. Pt would like zpak . [facility] in [place]. Pt is not having any symptoms

## 2018-01-24 MED ORDER — AZITHROMYCIN 250 MG PO TABS: ORAL_TABLET | ORAL | 0 refills | Status: DC

## 2018-01-24 NOTE — Telephone Encounter (Signed)
Pertussis? He was vaccinated in May. If he gets a cough, let me know and I'll send him through something, but he shouldn't get it.

## 2018-01-24 NOTE — Telephone Encounter (Signed)
He states that it is parapertussis, confirmed case at [employer], he does not have any symptoms. He states that the pediatrician went ahead and treated them, and his wife has been treated, he just wants to know if he should be treated just for precaution.

## 2018-01-24 NOTE — Telephone Encounter (Signed)
I've sent a z-pack to his pharmacy, but he probably doesn't need to take it as he's been vaccinated. It's over there just to be safe.

## 2018-01-25 NOTE — Telephone Encounter (Signed)
Patient notified

## 2018-05-13 ENCOUNTER — Encounter: Payer: Self-pay | Admitting: Family Medicine

## 2018-05-23 ENCOUNTER — Ambulatory Visit: Payer: Self-pay | Admitting: *Deleted

## 2018-05-23 ENCOUNTER — Ambulatory Visit (INDEPENDENT_AMBULATORY_CARE_PROVIDER_SITE_OTHER): Payer: BLUE CROSS/BLUE SHIELD | Admitting: Family Medicine

## 2018-05-23 ENCOUNTER — Encounter: Payer: Self-pay | Admitting: Family Medicine

## 2018-05-23 VITALS — BP 120/77 | HR 79 | Temp 97.9°F | Ht 72.03 in | Wt 240.0 lb

## 2018-05-23 DIAGNOSIS — R002 Palpitations: Secondary | ICD-10-CM

## 2018-05-23 MED ORDER — AMLODIPINE BESYLATE 10 MG PO TABS: ORAL_TABLET | ORAL | 3 refills | Status: DC

## 2018-05-23 MED ORDER — LISINOPRIL 5 MG PO TABS: 5.0000 mg | ORAL_TABLET | Freq: Every day | ORAL | 3 refills | Status: DC

## 2018-05-23 NOTE — Telephone Encounter (Signed)
I can see him at 2 or 3

## 2018-05-23 NOTE — Telephone Encounter (Signed)
Calls with heart palpitations he describes as a skipped beat feeling then several quick beats then its over. This comes and goes since 5pm yesterday. B/p last night was 129/89 he is on 2 antihypertensives he takes as directed.Denies CP/SOB/Sweating. Has had sinus congestion and pressure for about one week. Denies any fever/pain. Stated he has had this several years ago and wore a halter monitor which no results were determined. Recent events have his anxiety level up. Routed to PCP for possible appointment   Reason for Disposition . [1] Skipped or extra beat(s) AND [2] increases with exercise or exertion  Answer Assessment - Initial Assessment Questions 1. DESCRIPTION: "Please describe your heart rate or heart beat that you are having" (e.g., fast/slow, regular/irregular, skipped or extra beats, "palpitations")     Skipping a beat then catching up quickly he stated 2. ONSET: "When did it start?" (Minutes, hours or days)      5pm yesterday 3. DURATION: "How long does it last" (e.g., seconds, minutes, hours)     seconds 4. PATTERN "Does it come and go, or has it been constant since it started?"  "Does it get worse with exertion?"   "Are you feeling it now?"     Comes and goes 5. TAP: "Using your hand, can you tap out what you are feeling on a chair or table in front of you, so that I can hear?" (Note: not all patients can do this)        6. HEART RATE: "Can you tell me your heart rate?" "How many beats in 15 seconds?"  (Note: not all patients can do this)      80 bpm 7. RECURRENT SYMPTOM: "Have you ever had this before?" If so, ask: "When was the last time?" and "What happened that time?"      Yes, over time 3-4 years ago. 8. CAUSE: "What do you think is causing the palpitations?"     unsure 9. CARDIAC HISTORY: "Do you have any history of heart disease?" (e.g., heart attack, angina, bypass surgery, angioplasty, arrhythmia)      htn 10. OTHER SYMPTOMS: "Do you have any other symptoms?" (e.g.,  dizziness, chest pain, sweating, difficulty breathing)       no 11. PREGNANCY: "Is there any chance you are pregnant?" "When was your last menstrual period?"       na  Protocols used: [place]

## 2018-05-23 NOTE — Telephone Encounter (Signed)
TC to patient. He will be seeing Dr. Wynetta Emery at 2:00p today per Christan.

## 2018-05-23 NOTE — Progress Notes (Signed)
BP 120/77   Pulse 79   Temp 97.9 F (36.6 C) (Oral)   Ht 6' 0.03" (1.83 m)   Wt 240 lb (108.9 kg)   SpO2 97%   BMI 32.52 kg/m    Subjective:    [name], male    DOB: 10-25-1980, 38 y.o.   MRN: [id number]  HPI: Gordon Carson is a 38 y.o. male  Chief Complaint  Patient presents with  . Palpitations    Started last night at 5 p.m. has 3-10 in a 2 minute period then will not have any for an hour.    PALPITATIONS Duration: started last night, comes on for a a couple of seconds, then goes away for an hour and then comes back Symptom description: skipping beat Duration of episode: seconds Frequency: recurrentl Activity when event occurred: at random Related to exertion: no Dyspnea: no Chest pain: no Syncope: no Anxiety/stress: yes Nausea/vomiting: no Diaphoresis: no Coronary artery disease: no Congestive heart failure: no Arrhythmia:no Thyroid disease: no Caffeine intake: 1 cafienated beverage Status:  stable Treatments attempted:none  HYPERTENSION Hypertension status: controlled  Satisfied with current treatment? yes Duration of hypertension: chronic BP monitoring frequency:  not checking BP medication side effects:  no Medication compliance: excellent compliance Previous BP meds: amlodipine, lisinopril Aspirin: no Recurrent headaches: no Visual changes: no Palpitations: yes Dyspnea: no Chest pain: no Lower extremity edema: no Dizzy/lightheaded: no  Relevant past medical, surgical, family and social history reviewed and updated as indicated. Interim medical history since our last visit reviewed. Allergies and medications reviewed and updated.  Review of Systems  Constitutional: Negative.   Respiratory: Negative.   Cardiovascular: Positive for palpitations. Negative for chest pain and leg swelling.  Psychiatric/Behavioral: Negative.     Per HPI unless specifically indicated above     Objective:    BP 120/77   Pulse 79    Temp 97.9 F (36.6 C) (Oral)   Ht 6' 0.03" (1.83 m)   Wt 240 lb (108.9 kg)   SpO2 97%   BMI 32.52 kg/m   Wt Readings from Last 3 Encounters:  05/23/18 240 lb (108.9 kg)  06/12/17 241 lb 6 oz (109.5 kg)  05/31/17 243 lb 3 oz (110.3 kg)    Physical Exam Vitals signs and nursing note reviewed.  Constitutional:      General: He is not in acute distress.    Appearance: Normal appearance. He is not ill-appearing, toxic-appearing or diaphoretic.  HENT:     Head: Normocephalic and atraumatic.     Right Ear: External ear normal.     Left Ear: External ear normal.     Nose: Nose normal.     Mouth/Throat:     Mouth: Mucous membranes are moist.     Pharynx: Oropharynx is clear.  Eyes:     General: No scleral icterus.       Right eye: No discharge.        Left eye: No discharge.     Extraocular Movements: Extraocular movements intact.     Conjunctiva/sclera: Conjunctivae normal.     Pupils: Pupils are equal, round, and reactive to light.  Neck:     Musculoskeletal: Normal range of motion and neck supple.  Cardiovascular:     Rate and Rhythm: Normal rate and regular rhythm.     Pulses: Normal pulses.     Heart sounds: Normal heart sounds. No murmur. No friction rub. No gallop.   Pulmonary:     Effort: Pulmonary  effort is normal. No respiratory distress.     Breath sounds: Normal breath sounds. No stridor. No wheezing, rhonchi or rales.  Chest:     Chest wall: No tenderness.  Musculoskeletal: Normal range of motion.  Skin:    General: Skin is warm and dry.     Capillary Refill: Capillary refill takes less than 2 seconds.     Coloration: Skin is not jaundiced or pale.     Findings: No bruising, erythema, lesion or rash.  Neurological:     General: No focal deficit present.     Mental Status: He is alert and oriented to person, place, and time. Mental status is at baseline.  Psychiatric:        Mood and Affect: Mood normal.        Behavior: Behavior normal.        Thought  Content: Thought content normal.        Judgment: Judgment normal.     Results for orders placed or performed in visit on 06/12/17  Microscopic Examination  Result Value Ref Range   WBC, UA 0-5 0 - 5 /hpf   RBC, UA 0-2 0 - 2 /hpf   Bacteria, UA Few None seen/Few  Urine Culture, Reflex  Result Value Ref Range   Urine Culture, Routine Final report    Organism ID, Bacteria No growth   CBC with Differential/Platelet  Result Value Ref Range   WBC 6.6 3.4 - 10.8 x10E3/uL   RBC 5.28 4.14 - 5.80 x10E6/uL   Hemoglobin 16.5 13.0 - 17.7 g/dL   Hematocrit 47.4 37.5 - 51.0 %   MCV 90 79 - 97 fL   MCH 31.3 26.6 - 33.0 pg   MCHC 34.8 31.5 - 35.7 g/dL   RDW 13.5 12.3 - 15.4 %   Platelets 308 150 - 379 x10E3/uL   Neutrophils 57 Not Estab. %   Lymphs 29 Not Estab. %   Monocytes 8 Not Estab. %   Eos 4 Not Estab. %   Basos 1 Not Estab. %   Neutrophils Absolute 3.9 1.4 - 7.0 x10E3/uL   Lymphocytes Absolute 1.9 0.7 - 3.1 x10E3/uL   Monocytes Absolute 0.5 0.1 - 0.9 x10E3/uL   EOS (ABSOLUTE) 0.2 0.0 - 0.4 x10E3/uL   Basophils Absolute 0.0 0.0 - 0.2 x10E3/uL   Immature Granulocytes 1 Not Estab. %   Immature Grans (Abs) 0.0 0.0 - 0.1 x10E3/uL  Comprehensive metabolic panel  Result Value Ref Range   Glucose 82 65 - 99 mg/dL   BUN 12 6 - 20 mg/dL   Creatinine, Ser 0.98 0.76 - 1.27 mg/dL   GFR calc non Af Amer 99 >59 mL/min/1.73   GFR calc Af Amer 115 >59 mL/min/1.73   BUN/Creatinine Ratio 12 9 - 20   Sodium 140 134 - 144 mmol/L   Potassium 4.9 3.5 - 5.2 mmol/L   Chloride 99 96 - 106 mmol/L   CO2 24 20 - 29 mmol/L   Calcium 9.8 8.7 - 10.2 mg/dL   Total Protein 7.6 6.0 - 8.5 g/dL   Albumin 4.8 3.5 - 5.5 g/dL   Globulin, Total 2.8 1.5 - 4.5 g/dL   Albumin/Globulin Ratio 1.7 1.2 - 2.2   Bilirubin Total 0.8 0.0 - 1.2 mg/dL   Alkaline Phosphatase 64 39 - 117 IU/L   AST 18 0 - 40 IU/L   ALT 29 0 - 44 IU/L  Lipid Panel w/o Chol/HDL Ratio  Result Value Ref Range   Cholesterol, Total 198 100 -  199 mg/dL   Triglycerides 139 0 - 149 mg/dL   HDL 39 (L) >39 mg/dL   VLDL Cholesterol Cal 28 5 - 40 mg/dL   LDL Calculated 131 (H) 0 - 99 mg/dL  Microalbumin, Urine Waived  Result Value Ref Range   Microalb, Ur Waived 30 (H) 0 - 19 mg/L   Creatinine, Urine Waived 300 10 - 300 mg/dL   Microalb/Creat Ratio <30 <30 mg/g  TSH  Result Value Ref Range   TSH 1.290 0.450 - 4.500 uIU/mL  UA/M w/rflx Culture, Routine  Result Value Ref Range   Specific Gravity, UA 1.015 1.005 - 1.030   pH, UA 6.0 5.0 - 7.5   Color, UA Yellow Yellow   Appearance Ur Clear Clear   Leukocytes, UA 1+ (A) Negative   Protein, UA Negative Negative/Trace   Glucose, UA Negative Negative   Ketones, UA Negative Negative   RBC, UA Trace (A) Negative   Bilirubin, UA Negative Negative   Urobilinogen, Ur 0.2 0.2 - 1.0 mg/dL   Nitrite, UA Negative Negative   Microscopic Examination See below:    Urinalysis Reflex Comment       Assessment & Plan:   Problem List Items Addressed This Visit      Other   Palpitations - Primary   Relevant Orders   EKG 12-Lead (Completed)   TSH   CBC with Differential/Platelet   Comprehensive metabolic panel       Follow up plan: Return if symptoms worsen or fail to improve.

## 2018-05-24 LAB — CBC WITH DIFFERENTIAL/PLATELET
Basophils Absolute: 0.1 10*3/uL (ref 0.0–0.2)
Basos: 1 %
EOS (ABSOLUTE): 0.4 10*3/uL (ref 0.0–0.4)
Eos: 5 %
Hematocrit: 44.9 % (ref 37.5–51.0)
Hemoglobin: 15.7 g/dL (ref 13.0–17.7)
Immature Grans (Abs): 0 10*3/uL (ref 0.0–0.1)
Immature Granulocytes: 0 %
Lymphocytes Absolute: 2 10*3/uL (ref 0.7–3.1)
Lymphs: 23 %
MCH: 31 pg (ref 26.6–33.0)
MCHC: 35 g/dL (ref 31.5–35.7)
MCV: 89 fL (ref 79–97)
Monocytes Absolute: 0.4 10*3/uL (ref 0.1–0.9)
Monocytes: 5 %
Neutrophils Absolute: 5.8 10*3/uL (ref 1.4–7.0)
Neutrophils: 66 %
Platelets: 306 10*3/uL (ref 150–450)
RBC: 5.07 x10E6/uL (ref 4.14–5.80)
RDW: 13.2 % (ref 11.6–15.4)
WBC: 8.7 10*3/uL (ref 3.4–10.8)

## 2018-05-24 LAB — COMPREHENSIVE METABOLIC PANEL
ALK PHOS: 67 IU/L (ref 39–117)
ALT: 23 IU/L (ref 0–44)
AST: 15 IU/L (ref 0–40)
Albumin/Globulin Ratio: 2 (ref 1.2–2.2)
Albumin: 4.8 g/dL (ref 3.5–5.5)
BUN/Creatinine Ratio: 10 (ref 9–20)
BUN: 9 mg/dL (ref 6–20)
Bilirubin Total: 0.4 mg/dL (ref 0.0–1.2)
CO2: 23 mmol/L (ref 20–29)
Calcium: 9.6 mg/dL (ref 8.7–10.2)
Chloride: 100 mmol/L (ref 96–106)
Creatinine, Ser: 0.91 mg/dL (ref 0.76–1.27)
GFR calc Af Amer: 125 mL/min/{1.73_m2} (ref >59)
GFR calc non Af Amer: 108 mL/min/{1.73_m2} (ref >59)
Globulin, Total: 2.4 g/dL (ref 1.5–4.5)
Glucose: 88 mg/dL (ref 65–99)
Potassium: 4.5 mmol/L (ref 3.5–5.2)
Sodium: 140 mmol/L (ref 134–144)
Total Protein: 7.2 g/dL (ref 6.0–8.5)

## 2018-05-24 LAB — TSH: TSH: 0.992 u[IU]/mL (ref 0.450–4.500)

## 2018-06-13 ENCOUNTER — Encounter: Payer: BLUE CROSS/BLUE SHIELD | Admitting: Family Medicine

## 2018-06-19 NOTE — Progress Notes (Signed)
BP 117/80 (BP Location: Left Arm, Patient Position: Sitting, Cuff Size: Large)   Pulse 66   Temp 98.7 F (37.1 C)   Ht 6' 0.4" (1.839 m)   Wt 239 lb 8 oz (108.6 kg)   SpO2 96%   BMI 32.12 kg/m    Subjective:    [name], male    DOB: [birth year], 38 y.o.   MRN: [id number]  HPI: ZARIUS FURR is a 38 y.o. male presenting on 06/20/2018 for comprehensive medical examination. Current medical complaints include:  HYPERTENSION Hypertension status: controlled  Satisfied with current treatment? yes Duration of hypertension: chronic BP monitoring frequency:  not checking BP medication side effects:  no Medication compliance: excellent compliance Previous BP meds: amlodipine, lisinopril Aspirin: no Recurrent headaches: no Visual changes: no Palpitations: yes Dyspnea: no Chest pain: no Lower extremity edema: no Dizzy/lightheaded: no  He currently lives with: wife and kids Interim Problems from his last visit: no  Depression Screen done today and results listed below:  Depression screen [facility] 2/9 06/20/2018 06/20/2018 12/07/2016  Decreased Interest 0 0 0  Down, Depressed, Hopeless 0 0 0  PHQ - 2 Score 0 0 0  Altered sleeping 0 - 0  Tired, decreased energy 0 - 0  Change in appetite 0 - 0  Feeling bad or failure about yourself  0 - 0  Trouble concentrating 0 - 0  Moving slowly or fidgety/restless 0 - 0  Suicidal thoughts 0 - 0  PHQ-9 Score 0 - 0  Difficult doing work/chores Not difficult at all - -    Past Medical History:  Past Medical History:  Diagnosis Date  . Anxiety   . Hypertension   . Slipped epiphysis     Surgical History:  Past Surgical History:  Procedure Laterality Date  . APPENDECTOMY  2004  . HIP SURGERY  1998   pin placed    Medications:  Current Outpatient Medications on File Prior to Visit  Medication Sig  . amLODipine (NORVASC) 10 MG tablet TAKE 1 TABLET(10 MG) BY MOUTH DAILY  . lisinopril (PRINIVIL,ZESTRIL) 5 MG tablet  Take 1 tablet (5 mg total) by mouth daily.   No current facility-administered medications on file prior to visit.     Allergies:  Allergies  Allergen Reactions  . Codeine Nausea And Vomiting    Social History:  Social History   Socioeconomic History  . Marital status: Married    Spouse name: Not on file  . Number of children: Not on file  . Years of education: Not on file  . Highest education level: Not on file  Occupational History  . Not on file  Social Needs  . Financial resource strain: Not on file  . Food insecurity:    Worry: Not on file    Inability: Not on file  . Transportation needs:    Medical: Not on file    Non-medical: Not on file  Tobacco Use  . Smoking status: Never Smoker  . Smokeless tobacco: Never Used  Substance and Sexual Activity  . Alcohol use: No    Alcohol/week: 0.0 standard drinks  . Drug use: No  . Sexual activity: Yes  Lifestyle  . Physical activity:    Days per week: Not on file    Minutes per session: Not on file  . Stress: Not on file  Relationships  . Social connections:    Talks on phone: Not on file    Gets together: Not on file  Attends religious service: Not on file    Active member of club or organization: Not on file    Attends meetings of clubs or organizations: Not on file    Relationship status: Not on file  . Intimate partner violence:    Fear of current or ex partner: Not on file    Emotionally abused: Not on file    Physically abused: Not on file    Forced sexual activity: Not on file  Other Topics Concern  . Not on file  Social History Narrative  . Not on file   Social History   Tobacco Use  Smoking Status Never Smoker  Smokeless Tobacco Never Used   Social History   Substance and Sexual Activity  Alcohol Use No  . Alcohol/week: 0.0 standard drinks    Family History:  Family History  Problem Relation Age of Onset  . Cancer Mother        breast  . Hypertension Maternal Grandfather   . Stroke  Maternal Grandfather   . Heart disease Maternal Grandfather     Past medical history, surgical history, medications, allergies, family history and social history reviewed with patient today and changes made to appropriate areas of the chart.   Review of Systems  Constitutional: Negative.   HENT: Negative.   Eyes: Negative.   Respiratory: Negative.   Cardiovascular: Positive for palpitations. Negative for chest pain, orthopnea, claudication, leg swelling and PND.  Gastrointestinal: Positive for diarrhea and heartburn (with food choices). Negative for abdominal pain, blood in stool, constipation, melena, nausea and vomiting.  Genitourinary: Negative.   Musculoskeletal: Negative.   Skin: Negative.   Neurological: Negative.   Endo/Heme/Allergies: Negative.   Psychiatric/Behavioral: Negative.     All other ROS negative except what is listed above and in the HPI.      Objective:    BP 117/80 (BP Location: Left Arm, Patient Position: Sitting, Cuff Size: Large)   Pulse 66   Temp 98.7 F (37.1 C)   Ht 6' 0.4" (1.839 m)   Wt 239 lb 8 oz (108.6 kg)   SpO2 96%   BMI 32.12 kg/m   Wt Readings from Last 3 Encounters:  06/20/18 239 lb 8 oz (108.6 kg)  05/23/18 240 lb (108.9 kg)  06/12/17 241 lb 6 oz (109.5 kg)    Physical Exam Vitals signs and nursing note reviewed.  Constitutional:      General: He is not in acute distress.    Appearance: Normal appearance. He is obese. He is not ill-appearing, toxic-appearing or diaphoretic.  HENT:     Head: Normocephalic and atraumatic.     Right Ear: Tympanic membrane, ear canal and external ear normal. There is no impacted cerumen.     Left Ear: Tympanic membrane, ear canal and external ear normal. There is no impacted cerumen.     Nose: Nose normal. No congestion or rhinorrhea.     Mouth/Throat:     Mouth: Mucous membranes are moist.     Pharynx: Oropharynx is clear. No oropharyngeal exudate or posterior oropharyngeal erythema.  Eyes:      General: No scleral icterus.       Right eye: No discharge.        Left eye: No discharge.     Extraocular Movements: Extraocular movements intact.     Conjunctiva/sclera: Conjunctivae normal.     Pupils: Pupils are equal, round, and reactive to light.  Neck:     Musculoskeletal: Normal range of motion and neck supple. No  neck rigidity or muscular tenderness.     Vascular: No carotid bruit.  Cardiovascular:     Rate and Rhythm: Normal rate and regular rhythm.     Pulses: Normal pulses.     Heart sounds: No murmur. No friction rub. No gallop.   Pulmonary:     Effort: Pulmonary effort is normal. No respiratory distress.     Breath sounds: Normal breath sounds. No stridor. No wheezing, rhonchi or rales.  Chest:     Chest wall: No tenderness.  Abdominal:     General: Abdomen is flat. Bowel sounds are normal. There is no distension.     Palpations: Abdomen is soft. There is no mass.     Tenderness: There is no abdominal tenderness. There is no right CVA tenderness, left CVA tenderness, guarding or rebound.     Hernia: No hernia is present.  Genitourinary:    Comments: Genital exam deferred with shared decision making Musculoskeletal:        General: No swelling, tenderness, deformity or signs of injury.     Right lower leg: No edema.     Left lower leg: No edema.  Lymphadenopathy:     Cervical: No cervical adenopathy.  Skin:    General: Skin is warm and dry.     Capillary Refill: Capillary refill takes less than 2 seconds.     Coloration: Skin is not jaundiced or pale.     Findings: No bruising, erythema, lesion or rash.  Neurological:     General: No focal deficit present.     Mental Status: He is alert and oriented to person, place, and time.     Cranial Nerves: No cranial nerve deficit.     Sensory: No sensory deficit.     Motor: No weakness.     Coordination: Coordination normal.     Gait: Gait normal.     Deep Tendon Reflexes: Reflexes normal.  Psychiatric:        Mood  and Affect: Mood normal.        Behavior: Behavior normal.        Thought Content: Thought content normal.        Judgment: Judgment normal.     Results for orders placed or performed in visit on 05/23/18  TSH  Result Value Ref Range   TSH 0.992 0.450 - 4.500 uIU/mL  CBC with Differential/Platelet  Result Value Ref Range   WBC 8.7 3.4 - 10.8 x10E3/uL   RBC 5.07 4.14 - 5.80 x10E6/uL   Hemoglobin 15.7 13.0 - 17.7 g/dL   Hematocrit 44.9 37.5 - 51.0 %   MCV 89 79 - 97 fL   MCH 31.0 26.6 - 33.0 pg   MCHC 35.0 31.5 - 35.7 g/dL   RDW 13.2 11.6 - 15.4 %   Platelets 306 150 - 450 x10E3/uL   Neutrophils 66 Not Estab. %   Lymphs 23 Not Estab. %   Monocytes 5 Not Estab. %   Eos 5 Not Estab. %   Basos 1 Not Estab. %   Neutrophils Absolute 5.8 1.4 - 7.0 x10E3/uL   Lymphocytes Absolute 2.0 0.7 - 3.1 x10E3/uL   Monocytes Absolute 0.4 0.1 - 0.9 x10E3/uL   EOS (ABSOLUTE) 0.4 0.0 - 0.4 x10E3/uL   Basophils Absolute 0.1 0.0 - 0.2 x10E3/uL   Immature Granulocytes 0 Not Estab. %   Immature Grans (Abs) 0.0 0.0 - 0.1 x10E3/uL  Comprehensive metabolic panel  Result Value Ref Range   Glucose 88 65 - 99 mg/dL  BUN 9 6 - 20 mg/dL   Creatinine, Ser 0.91 0.76 - 1.27 mg/dL   GFR calc non Af Amer 108 >59 mL/min/1.73   GFR calc Af Amer 125 >59 mL/min/1.73   BUN/Creatinine Ratio 10 9 - 20   Sodium 140 134 - 144 mmol/L   Potassium 4.5 3.5 - 5.2 mmol/L   Chloride 100 96 - 106 mmol/L   CO2 23 20 - 29 mmol/L   Calcium 9.6 8.7 - 10.2 mg/dL   Total Protein 7.2 6.0 - 8.5 g/dL   Albumin 4.8 3.5 - 5.5 g/dL   Globulin, Total 2.4 1.5 - 4.5 g/dL   Albumin/Globulin Ratio 2.0 1.2 - 2.2   Bilirubin Total 0.4 0.0 - 1.2 mg/dL   Alkaline Phosphatase 67 39 - 117 IU/L   AST 15 0 - 40 IU/L   ALT 23 0 - 44 IU/L      Assessment & Plan:   Problem List Items Addressed This Visit      Genitourinary   Benign hypertensive renal disease    Under good control on current regimen. Continue current regimen. Continue to  monitor. Call with any concerns. Refills given. Labs checked today.         Other   Elevated cholesterol    Rechecking labs today. Await results. Call with any concerns.        Other Visit Diagnoses    Routine general medical examination at a health care facility    -  Primary   Vaccines up to date. Screening labs checked today. Continue diet and exercise. Call with any concerns.    Relevant Orders   Lipid Panel w/o Chol/HDL Ratio   UA/M w/rflx Culture, Routine   HIV Antibody (routine testing w rflx)   Microalbumin, Urine Waived       LABORATORY TESTING:  Health maintenance labs ordered today as discussed above.    IMMUNIZATIONS:   - Tdap: Tetanus vaccination status reviewed: last tetanus booster within 10 years. - Influenza: Up to date - Pneumovax: Not applicable  PATIENT COUNSELING:    Sexuality: Discussed sexually transmitted diseases, partner selection, use of condoms, avoidance of unintended pregnancy  and contraceptive alternatives.   Advised to avoid cigarette smoking.  I discussed with the patient that most people either abstain from alcohol or drink within safe limits (<=14/week and <=4 drinks/occasion for males, <=7/weeks and <= 3 drinks/occasion for females) and that the risk for alcohol disorders and other health effects rises proportionally with the number of drinks per week and how often a drinker exceeds daily limits.  Discussed cessation/primary prevention of drug use and availability of treatment for abuse.   Diet: Encouraged to adjust caloric intake to maintain  or achieve ideal body weight, to reduce intake of dietary saturated fat and total fat, to limit sodium intake by avoiding high sodium foods and not adding table salt, and to maintain adequate dietary potassium and calcium preferably from fresh fruits, vegetables, and low-fat dairy products.    stressed the importance of regular exercise  Injury prevention: Discussed safety belts, safety helmets,  smoke detector, smoking near bedding or upholstery.   Dental health: Discussed importance of regular tooth brushing, flossing, and dental visits.   Follow up plan: NEXT PREVENTATIVE PHYSICAL DUE IN 1 YEAR. Return in about 6 months (around 12/19/2018) for BP follow up.

## 2018-06-20 ENCOUNTER — Ambulatory Visit (INDEPENDENT_AMBULATORY_CARE_PROVIDER_SITE_OTHER): Payer: BLUE CROSS/BLUE SHIELD | Admitting: Family Medicine

## 2018-06-20 ENCOUNTER — Encounter: Payer: Self-pay | Admitting: Family Medicine

## 2018-06-20 ENCOUNTER — Other Ambulatory Visit: Payer: Self-pay

## 2018-06-20 VITALS — BP 117/80 | HR 66 | Temp 98.7°F | Ht 72.4 in | Wt 239.5 lb

## 2018-06-20 DIAGNOSIS — I129 Hypertensive chronic kidney disease with stage 1 through stage 4 chronic kidney disease, or unspecified chronic kidney disease: Secondary | ICD-10-CM | POA: Diagnosis not present

## 2018-06-20 DIAGNOSIS — E7800 Pure hypercholesterolemia, unspecified: Secondary | ICD-10-CM

## 2018-06-20 DIAGNOSIS — Z0000 Encounter for general adult medical examination without abnormal findings: Secondary | ICD-10-CM

## 2018-06-20 LAB — MICROALBUMIN, URINE WAIVED
CREATININE, URINE WAIVED: 50 mg/dL (ref 10–300)
Microalb, Ur Waived: 10 mg/L (ref 0–19)
Microalb/Creat Ratio: <30 mg/g (ref <30)

## 2018-06-20 LAB — UA/M W/RFLX CULTURE, ROUTINE
BILIRUBIN UA: NEGATIVE
GLUCOSE, UA: NEGATIVE
Ketones, UA: NEGATIVE
Leukocytes, UA: NEGATIVE
Nitrite, UA: NEGATIVE
Protein, UA: NEGATIVE
RBC, UA: NEGATIVE
Specific Gravity, UA: 1.015 (ref 1.005–1.030)
UUROB: 0.2 mg/dL (ref 0.2–1.0)
pH, UA: 7 (ref 5.0–7.5)

## 2018-06-20 NOTE — Assessment & Plan Note (Signed)
Rechecking labs today. Await results. Call with any concerns.  

## 2018-06-20 NOTE — Assessment & Plan Note (Signed)
Under good control on current regimen. Continue current regimen. Continue to monitor. Call with any concerns. Refills given. Labs checked today.  

## 2018-06-20 NOTE — Patient Instructions (Signed)

## 2018-06-21 LAB — LIPID PANEL W/O CHOL/HDL RATIO
CHOLESTEROL TOTAL: 221 mg/dL — AB (ref 100–199)
HDL: 43 mg/dL (ref >39)
LDL Calculated: 153 mg/dL — ABNORMAL HIGH (ref 0–99)
Triglycerides: 127 mg/dL (ref 0–149)
VLDL Cholesterol Cal: 25 mg/dL (ref 5–40)

## 2018-06-21 LAB — HIV ANTIBODY (ROUTINE TESTING W REFLEX): HIV Screen 4th Generation wRfx: NONREACTIVE

## 2018-12-18 ENCOUNTER — Telehealth: Payer: Self-pay | Admitting: Family Medicine

## 2018-12-18 NOTE — Telephone Encounter (Signed)
Called pt to reschedule 8/13 appt, no answer, left vm

## 2018-12-26 ENCOUNTER — Ambulatory Visit: Payer: BLUE CROSS/BLUE SHIELD | Admitting: Family Medicine

## 2019-01-23 ENCOUNTER — Other Ambulatory Visit: Payer: Self-pay

## 2019-01-23 DIAGNOSIS — Z20822 Contact with and (suspected) exposure to covid-19: Secondary | ICD-10-CM

## 2019-01-24 LAB — SPECIMEN STATUS REPORT

## 2019-01-24 LAB — NOVEL CORONAVIRUS, NAA: SARS-CoV-2, NAA: NOT DETECTED

## 2019-02-26 ENCOUNTER — Other Ambulatory Visit: Payer: Self-pay

## 2019-02-26 DIAGNOSIS — Z20822 Contact with and (suspected) exposure to covid-19: Secondary | ICD-10-CM

## 2019-02-27 ENCOUNTER — Other Ambulatory Visit: Payer: Self-pay

## 2019-02-27 ENCOUNTER — Ambulatory Visit (INDEPENDENT_AMBULATORY_CARE_PROVIDER_SITE_OTHER): Payer: BLUE CROSS/BLUE SHIELD | Admitting: Family Medicine

## 2019-02-27 ENCOUNTER — Encounter: Payer: Self-pay | Admitting: Family Medicine

## 2019-02-27 VITALS — BP 129/85 | Temp 98.6°F | Ht 73.0 in | Wt 230.0 lb

## 2019-02-27 DIAGNOSIS — J069 Acute upper respiratory infection, unspecified: Secondary | ICD-10-CM

## 2019-02-27 LAB — NOVEL CORONAVIRUS, NAA: SARS-CoV-2, NAA: NOT DETECTED

## 2019-02-27 MED ORDER — FLUTICASONE PROPIONATE 50 MCG/ACT NA SUSP: 2.0000 | Freq: Two times a day (BID) | NASAL | 6 refills | Status: DC | PRN

## 2019-02-27 NOTE — Progress Notes (Signed)
BP 129/85   Temp 98.6 F (37 C) (Temporal)   Ht 6\' 1"  (1.854 m)   Wt 230 lb (104.3 kg)   BMI 30.34 kg/m    Subjective:    [name], male    DOB: [birth year], 38 y.o.   MRN: [id number]  HPI: Gordon Carson is a 38 y.o. male  Chief Complaint  Patient presents with  . Nasal Congestion    x 3 days ago. patient states tha the got tested for covid 19 awaiting results  . Ear Problem    left ear stopped up    . This visit was completed via WebEx due to the restrictions of the COVID-19 pandemic. All issues as above were discussed and addressed. Physical exam was done as above through visual confirmation on WebEx. If it was felt that the patient should be evaluated in the office, they were directed there. The patient verbally consented to this visit. . Location of the patient: work . Location of the provider: work . Those involved with this call:  . Provider: Merrie Roof, PA-C . CMA: Tiffany Reel, CMA . Front Desk/Registration: Jill Side  . Time spent on call: 15 minutes with patient face to face via video conference. More than 50% of this time was spent in counseling and coordination of care. 5 minutes total spent in review of patient's record and preparation of their chart. I verified patient identity using two factors (patient name and date of birth). Patient consents verbally to being seen via telemedicine visit today.   Started 4 days ago with headache, sore throat, rhinorrhea, left ear fullness and muffled hearing. Denies fever, cough, ear pain, headache, N/V/D. Feeling a bit better today but ear still full. Got flu shot on Saturday. 3 kids and wife all sick as well. Kids have neg COVID tests, his results are still pending. Has not been taking anything for sxs.   Relevant past medical, surgical, family and social history reviewed and updated as indicated. Interim medical history since our last visit reviewed. Allergies and medications reviewed and  updated.  Review of Systems  Per HPI unless specifically indicated above     Objective:    BP 129/85   Temp 98.6 F (37 C) (Temporal)   Ht 6\' 1"  (1.854 m)   Wt 230 lb (104.3 kg)   BMI 30.34 kg/m   Wt Readings from Last 3 Encounters:  02/27/19 230 lb (104.3 kg)  06/20/18 239 lb 8 oz (108.6 kg)  05/23/18 240 lb (108.9 kg)    Physical Exam Vitals signs and nursing note reviewed.  Constitutional:      General: He is not in acute distress.    Appearance: Normal appearance.  HENT:     Head: Atraumatic.     Right Ear: External ear normal.     Left Ear: External ear normal.     Nose: Rhinorrhea present.     Mouth/Throat:     Mouth: Mucous membranes are moist.     Pharynx: Oropharynx is clear. Posterior oropharyngeal erythema present.  Eyes:     Extraocular Movements: Extraocular movements intact.     Conjunctiva/sclera: Conjunctivae normal.  Neck:     Musculoskeletal: Normal range of motion.  Cardiovascular:     Rate and Rhythm: Normal rate and regular rhythm.  Pulmonary:     Effort: Pulmonary effort is normal. No respiratory distress.  Musculoskeletal: Normal range of motion.  Skin:    General: Skin is dry.  Findings: No erythema or rash.  Neurological:     Mental Status: He is oriented to person, place, and time.  Psychiatric:        Mood and Affect: Mood normal.        Thought Content: Thought content normal.        Judgment: Judgment normal.     Results for orders placed or performed in visit on 02/26/19  Novel Coronavirus, NAA ([facility])   Specimen: Oropharyngeal(OP) collection in vial transport medium   OROPHARYNGEA  TESTING  Result Value Ref Range   SARS-CoV-2, NAA Not Detected Not Detected      Assessment & Plan:   Problem List Items Addressed This Visit    None    Visit Diagnoses    Viral URI    -  Primary   COVID test pending, sxs improving. Will tx with zyrtec, flonase, supportive care. F/u if not improving       Follow up plan: Return  if symptoms worsen or fail to improve.

## 2019-02-28 ENCOUNTER — Telehealth: Payer: Self-pay | Admitting: Family Medicine

## 2019-02-28 MED ORDER — AMOXICILLIN-POT CLAVULANATE 875-125 MG PO TABS: 1.0000 | ORAL_TABLET | Freq: Two times a day (BID) | ORAL | 0 refills | Status: DC

## 2019-02-28 NOTE — Telephone Encounter (Signed)
Patient notified

## 2019-02-28 NOTE — Telephone Encounter (Signed)
I know he had said his ear wasn't hurting yesterday but it sounds like he's worsened today. Let him know I sent over an antibiotic for him and he should continue flonase and other OTC remedies as discussed additionally  Copied from [place] [phone]. Topic: General - Other >> Feb 28, 2019  8:20 AM Keene Breath wrote: Reason for CRM: Patient called to ask the nurse or doctor to call him regarding an ear infection that he thinks he has.  Patient stated that he thinks he needs an antibiotic or a stronger medication.  Please call to discuss at [phone]

## 2019-03-03 ENCOUNTER — Telehealth: Payer: Self-pay | Admitting: Family Medicine

## 2019-03-03 NOTE — Telephone Encounter (Signed)
Called patient. Left VM for patient to return call to the office. 

## 2019-03-03 NOTE — Telephone Encounter (Signed)
Patient was prescribed Amoxicillin on Friday, but he is not feeling better. He says he is feeling worse.  His left ear has started hurting badly and it's completely clogged. Please advise.

## 2019-03-03 NOTE — Telephone Encounter (Signed)
I can either send in prednisone or he can come be evaluated in person to see what's going on

## 2019-03-04 NOTE — Telephone Encounter (Signed)
Patient states that he will just continue the antibiotic at this time.

## 2019-03-04 NOTE — Telephone Encounter (Signed)
Called and LVM for patient to please return my call. 

## 2019-06-28 ENCOUNTER — Other Ambulatory Visit: Payer: Self-pay | Admitting: Family Medicine

## 2019-07-26 ENCOUNTER — Other Ambulatory Visit: Payer: Self-pay | Admitting: Family Medicine

## 2019-07-26 NOTE — Telephone Encounter (Signed)
Requested  medications are  due for refill today yes  Requested medications are on the active medication list yes  Last refill 2/13  Future visit scheduled no  Notes to clinic failed protocol for past due labs and office visit.

## 2019-07-28 NOTE — Telephone Encounter (Signed)
Scheduled apt for 08/07/2019 Pt verbalized understanding.

## 2019-07-28 NOTE — Telephone Encounter (Signed)
Does he have enough to get to his appointment? 

## 2019-07-28 NOTE — Telephone Encounter (Signed)
Please call and schedule appointment first. Has not been seen in over a year for routine f/up.

## 2019-07-28 NOTE — Telephone Encounter (Signed)
Patient states he has enough medication to get to appointment.

## 2019-07-30 ENCOUNTER — Other Ambulatory Visit: Payer: Self-pay | Admitting: Family Medicine

## 2019-07-30 MED ORDER — LISINOPRIL 5 MG PO TABS: ORAL_TABLET | ORAL | 0 refills | Status: DC

## 2019-07-30 MED ORDER — AMLODIPINE BESYLATE 10 MG PO TABS: ORAL_TABLET | ORAL | 0 refills | Status: DC

## 2019-07-30 NOTE — Telephone Encounter (Signed)
Instructed to keep appointment. Courtesy refill.

## 2019-07-30 NOTE — Telephone Encounter (Signed)
Medication Refill - Medication:amLODipine (NORVASC) 10 MG tablet lisinopril (ZESTRIL) 5 MG tablet   Preferred Pharmacy (with phone number or street name):  [facility] [id number], [place] Phone:  [phone]  Fax:  [phone]       Agent: Please be advised that RX refills may take up to 3 business days. We ask that you follow-up with your pharmacy.

## 2019-07-31 ENCOUNTER — Other Ambulatory Visit: Payer: Self-pay | Admitting: Family Medicine

## 2019-08-07 ENCOUNTER — Other Ambulatory Visit: Payer: Self-pay

## 2019-08-07 ENCOUNTER — Ambulatory Visit (INDEPENDENT_AMBULATORY_CARE_PROVIDER_SITE_OTHER): Payer: 59 | Admitting: Family Medicine

## 2019-08-07 ENCOUNTER — Encounter: Payer: Self-pay | Admitting: Family Medicine

## 2019-08-07 VITALS — BP 116/77 | HR 64 | Temp 97.7°F

## 2019-08-07 DIAGNOSIS — E7800 Pure hypercholesterolemia, unspecified: Secondary | ICD-10-CM

## 2019-08-07 DIAGNOSIS — I129 Hypertensive chronic kidney disease with stage 1 through stage 4 chronic kidney disease, or unspecified chronic kidney disease: Secondary | ICD-10-CM

## 2019-08-07 LAB — MICROALBUMIN, URINE WAIVED
Creatinine, Urine Waived: 200 mg/dL (ref 10–300)
Microalb, Ur Waived: 30 mg/L — ABNORMAL HIGH (ref 0–19)
Microalb/Creat Ratio: <30 mg/g

## 2019-08-07 MED ORDER — AMLODIPINE BESYLATE 10 MG PO TABS: 10.0000 mg | ORAL_TABLET | Freq: Every day | ORAL | 1 refills | Status: DC

## 2019-08-07 MED ORDER — LISINOPRIL 5 MG PO TABS: ORAL_TABLET | ORAL | 1 refills | Status: DC

## 2019-08-07 NOTE — Progress Notes (Signed)
BP 116/77 (BP Location: Left Arm, Patient Position: Sitting, Cuff Size: Normal)   Pulse 64   Temp 97.7 F (36.5 C) (Oral)   SpO2 97%    Subjective:    [name], male    DOB: 17-Oct-1980, 39 y.o.   MRN: [id number]  HPI: Gordon Carson is a 39 y.o. male  Chief Complaint  Patient presents with  . Hypertension    Medication refill-Amlodipine and Lisinopril  . Hyperlipidemia   HYPERTENSION / HYPERLIPIDEMIA Satisfied with current treatment? no Duration of hypertension: chronic BP monitoring frequency: a few times a month BP medication side effects: no Past BP meds: amlodipine, lisinopril Duration of hyperlipidemia: chronic Cholesterol medication side effects: no Cholesterol supplements: none Past cholesterol medications: none Medication compliance: excellent compliance Aspirin: no Recent stressors: no Recurrent headaches: no Visual changes: no Palpitations: no Dyspnea: no Chest pain: no Lower extremity edema: no Dizzy/lightheaded: no  Relevant past medical, surgical, family and social history reviewed and updated as indicated. Interim medical history since our last visit reviewed. Allergies and medications reviewed and updated.  Review of Systems  Constitutional: Negative.   Respiratory: Negative.   Cardiovascular: Negative.   Musculoskeletal: Negative.   Psychiatric/Behavioral: Negative.     Per HPI unless specifically indicated above     Objective:    BP 116/77 (BP Location: Left Arm, Patient Position: Sitting, Cuff Size: Normal)   Pulse 64   Temp 97.7 F (36.5 C) (Oral)   SpO2 97%   Wt Readings from Last 3 Encounters:  02/27/19 230 lb (104.3 kg)  06/20/18 239 lb 8 oz (108.6 kg)  05/23/18 240 lb (108.9 kg)    Physical Exam Vitals and nursing note reviewed.  Constitutional:      General: He is not in acute distress.    Appearance: Normal appearance. He is not ill-appearing, toxic-appearing or diaphoretic.  HENT:     Head:  Normocephalic and atraumatic.     Right Ear: External ear normal.     Left Ear: External ear normal.     Nose: Nose normal.     Mouth/Throat:     Mouth: Mucous membranes are moist.     Pharynx: Oropharynx is clear.  Eyes:     General: No scleral icterus.       Right eye: No discharge.        Left eye: No discharge.     Extraocular Movements: Extraocular movements intact.     Conjunctiva/sclera: Conjunctivae normal.     Pupils: Pupils are equal, round, and reactive to light.  Cardiovascular:     Rate and Rhythm: Normal rate and regular rhythm.     Pulses: Normal pulses.     Heart sounds: Normal heart sounds. No murmur. No friction rub. No gallop.   Pulmonary:     Effort: Pulmonary effort is normal. No respiratory distress.     Breath sounds: Normal breath sounds. No stridor. No wheezing, rhonchi or rales.  Chest:     Chest wall: No tenderness.  Musculoskeletal:        General: Normal range of motion.     Cervical back: Normal range of motion and neck supple.  Skin:    General: Skin is warm and dry.     Capillary Refill: Capillary refill takes less than 2 seconds.     Coloration: Skin is not jaundiced or pale.     Findings: No bruising, erythema, lesion or rash.  Neurological:     General: No focal deficit  present.     Mental Status: He is alert and oriented to person, place, and time. Mental status is at baseline.  Psychiatric:        Mood and Affect: Mood normal.        Behavior: Behavior normal.        Thought Content: Thought content normal.        Judgment: Judgment normal.     Results for orders placed or performed in visit on 02/26/19  Novel Coronavirus, NAA ([facility])   Specimen: Oropharyngeal(OP) collection in vial transport medium   OROPHARYNGEA  TESTING  Result Value Ref Range   SARS-CoV-2, NAA Not Detected Not Detected      Assessment & Plan:   Problem List Items Addressed This Visit      Genitourinary   Benign hypertensive renal disease - Primary     Under good control on current regimen. Continue current regimen. Continue to monitor. Call with any concerns. Refills given. Labs drawn today.       Relevant Orders   Comprehensive metabolic panel   Microalbumin, Urine Waived     Other   Elevated cholesterol    Rechecking labs today. Await results. Treat as needed. Call with any concerns.       Relevant Medications   amLODipine (NORVASC) 10 MG tablet   lisinopril (ZESTRIL) 5 MG tablet   Other Relevant Orders   Comprehensive metabolic panel   Lipid Panel w/o Chol/HDL Ratio       Follow up plan: Return in about 6 months (around 02/07/2020) for physical.

## 2019-08-07 NOTE — Assessment & Plan Note (Signed)
Under good control on current regimen. Continue current regimen. Continue to monitor. Call with any concerns. Refills given. Labs drawn today.   

## 2019-08-07 NOTE — Assessment & Plan Note (Signed)
Rechecking labs today. Await results. Treat as needed. Call with any concerns.  

## 2019-08-08 LAB — COMPREHENSIVE METABOLIC PANEL
ALT: 24 IU/L (ref 0–44)
AST: 21 IU/L (ref 0–40)
Albumin/Globulin Ratio: 1.7 (ref 1.2–2.2)
Albumin: 4.7 g/dL (ref 4.0–5.0)
Alkaline Phosphatase: 65 IU/L (ref 39–117)
BUN/Creatinine Ratio: 11 (ref 9–20)
BUN: 11 mg/dL (ref 6–20)
Bilirubin Total: 0.8 mg/dL (ref 0.0–1.2)
CO2: 24 mmol/L (ref 20–29)
Calcium: 9.3 mg/dL (ref 8.7–10.2)
Chloride: 101 mmol/L (ref 96–106)
Creatinine, Ser: 0.99 mg/dL (ref 0.76–1.27)
GFR calc Af Amer: 112 mL/min/{1.73_m2} (ref >59)
GFR calc non Af Amer: 97 mL/min/{1.73_m2} (ref >59)
Globulin, Total: 2.7 g/dL (ref 1.5–4.5)
Glucose: 77 mg/dL (ref 65–99)
Potassium: 4.4 mmol/L (ref 3.5–5.2)
Sodium: 139 mmol/L (ref 134–144)
Total Protein: 7.4 g/dL (ref 6.0–8.5)

## 2019-08-08 LAB — LIPID PANEL W/O CHOL/HDL RATIO
Cholesterol, Total: 205 mg/dL — ABNORMAL HIGH (ref 100–199)
HDL: 43 mg/dL (ref >39)
LDL Chol Calc (NIH): 145 mg/dL — ABNORMAL HIGH (ref 0–99)
Triglycerides: 92 mg/dL (ref 0–149)
VLDL Cholesterol Cal: 17 mg/dL (ref 5–40)

## 2020-02-10 ENCOUNTER — Encounter: Payer: 59 | Admitting: Family Medicine

## 2020-02-16 ENCOUNTER — Ambulatory Visit: Payer: Self-pay | Admitting: *Deleted

## 2020-02-16 NOTE — Telephone Encounter (Signed)
Patient needs appt with [place] this week or to go to urgent care - worried this could be something like diverticulitis which requires careful assessment and possibly antibiotics.

## 2020-02-16 NOTE — Telephone Encounter (Signed)
Called pt he had an appt scheduled with UC.  KP

## 2020-02-16 NOTE — Telephone Encounter (Signed)
Patient is calling to report he started a new diet last Monday- and was doing well- fruit and juices. Patient changed diet Thursday night and since then he had had LLQ abdominal pain- mild with diarrhea. Patient reports he is under a lot of stress too.  Advised no appointment in office today- per protocol- advised UC. Patient wants to know if he can get GI referral- advised would send request.  Reason for Disposition . [1] MILD-MODERATE pain AND [2] constant AND [3] present > 2 hours  Answer Assessment - Initial Assessment Questions 1. LOCATION: "Where does it hurt?"      Lower abdomen 2. RADIATION: "Does the pain shoot anywhere else?" (e.g., chest, back)    no 3. ONSET: "When did the pain begin?" (Minutes, hours or days ago)      Late Thursday/Friday am 4. SUDDEN: "Gradual or sudden onset?"     sudden 5. PATTERN "Does the pain come and go, or is it constant?"    - If constant: "Is it getting better, staying the same, or worsening?"      (Note: Constant means the pain never goes away completely; most serious pain is constant and it progresses)     - If intermittent: "How long does it last?" "Do you have pain now?"     (Note: Intermittent means the pain goes away completely between bouts)     Constant-same, minimal pain-yes 6. SEVERITY: "How bad is the pain?"  (e.g., Scale 1-10; mild, moderate, or severe)    - MILD (1-3): doesn't interfere with normal activities, abdomen soft and not tender to touch     - MODERATE (4-7): interferes with normal activities or awakens from sleep, tender to touch     - SEVERE (8-10): excruciating pain, doubled over, unable to do any normal activities      mild 7. RECURRENT SYMPTOM: "Have you ever had this type of stomach pain before?" If Yes, ask: "When was the last time?" and "What happened that time?"      no 8. CAUSE: "What do you think is causing the stomach pain?"     stress 9. RELIEVING/AGGRAVATING FACTORS: "What makes it better or worse?" (e.g.,  movement, antacids, bowel movement)     Nothing helps 10. OTHER SYMPTOMS: "Has there been any vomiting, diarrhea, constipation, or urine problems?"       Diarrhea- extra stools- 2-3 days  Protocols used: ABDOMINAL PAIN - MALE-A-AH

## 2020-02-17 ENCOUNTER — Inpatient Hospital Stay
Admission: EM | Admit: 2020-02-17 | Discharge: 2020-02-20 | DRG: 385 | Disposition: A | Discharge status: Home / Self Care | Payer: 59 | Attending: Internal Medicine | Admitting: Internal Medicine

## 2020-02-17 ENCOUNTER — Emergency Department: Payer: 59

## 2020-02-17 ENCOUNTER — Other Ambulatory Visit: Payer: Self-pay

## 2020-02-17 DIAGNOSIS — K50818 Crohn's disease of both small and large intestine with other complication: Secondary | ICD-10-CM | POA: Diagnosis not present

## 2020-02-17 DIAGNOSIS — K529 Noninfective gastroenteritis and colitis, unspecified: Secondary | ICD-10-CM | POA: Diagnosis not present

## 2020-02-17 DIAGNOSIS — Z823 Family history of stroke: Secondary | ICD-10-CM

## 2020-02-17 DIAGNOSIS — Z8249 Family history of ischemic heart disease and other diseases of the circulatory system: Secondary | ICD-10-CM | POA: Diagnosis not present

## 2020-02-17 DIAGNOSIS — K631 Perforation of intestine (nontraumatic): Secondary | ICD-10-CM | POA: Diagnosis present

## 2020-02-17 DIAGNOSIS — I10 Essential (primary) hypertension: Secondary | ICD-10-CM | POA: Diagnosis present

## 2020-02-17 DIAGNOSIS — R630 Anorexia: Secondary | ICD-10-CM | POA: Diagnosis present

## 2020-02-17 DIAGNOSIS — Z20822 Contact with and (suspected) exposure to covid-19: Secondary | ICD-10-CM | POA: Diagnosis present

## 2020-02-17 DIAGNOSIS — Z79899 Other long term (current) drug therapy: Secondary | ICD-10-CM | POA: Diagnosis not present

## 2020-02-17 DIAGNOSIS — K5080 Crohn's disease of both small and large intestine without complications: Principal | ICD-10-CM | POA: Diagnosis present

## 2020-02-17 DIAGNOSIS — J309 Allergic rhinitis, unspecified: Secondary | ICD-10-CM | POA: Diagnosis present

## 2020-02-17 DIAGNOSIS — F909 Attention-deficit hyperactivity disorder, unspecified type: Secondary | ICD-10-CM | POA: Diagnosis present

## 2020-02-17 DIAGNOSIS — Z885 Allergy status to narcotic agent status: Secondary | ICD-10-CM | POA: Diagnosis not present

## 2020-02-17 DIAGNOSIS — F419 Anxiety disorder, unspecified: Secondary | ICD-10-CM | POA: Diagnosis present

## 2020-02-17 DIAGNOSIS — Z6830 Body mass index (BMI) 30.0-30.9, adult: Secondary | ICD-10-CM

## 2020-02-17 DIAGNOSIS — E669 Obesity, unspecified: Secondary | ICD-10-CM | POA: Diagnosis present

## 2020-02-17 DIAGNOSIS — R1084 Generalized abdominal pain: Secondary | ICD-10-CM

## 2020-02-17 LAB — COMPREHENSIVE METABOLIC PANEL
ALT: 18 U/L (ref 0–44)
AST: 14 U/L — ABNORMAL LOW (ref 15–41)
Albumin: 4.3 g/dL (ref 3.5–5.0)
Alkaline Phosphatase: 58 U/L (ref 38–126)
Anion gap: 10 (ref 5–15)
BUN: 10 mg/dL (ref 6–20)
CO2: 25 mmol/L (ref 22–32)
Calcium: 9 mg/dL (ref 8.9–10.3)
Chloride: 98 mmol/L (ref 98–111)
Creatinine, Ser: 0.97 mg/dL (ref 0.61–1.24)
GFR calc non Af Amer: >60 mL/min (ref >60)
Glucose, Bld: 91 mg/dL (ref 70–99)
Potassium: 3.8 mmol/L (ref 3.5–5.1)
Sodium: 133 mmol/L — ABNORMAL LOW (ref 135–145)
Total Bilirubin: 1.2 mg/dL (ref 0.3–1.2)
Total Protein: 7.9 g/dL (ref 6.5–8.1)

## 2020-02-17 LAB — URINALYSIS, COMPLETE (UACMP) WITH MICROSCOPIC
Bacteria, UA: NONE SEEN
Bilirubin Urine: NEGATIVE
Glucose, UA: NEGATIVE mg/dL
Ketones, ur: 20 mg/dL — AB
Nitrite: NEGATIVE
Protein, ur: NEGATIVE mg/dL
Specific Gravity, Urine: 1.013 (ref 1.005–1.030)
Squamous Epithelial / HPF: NONE SEEN (ref 0–5)
pH: 6 (ref 5.0–8.0)

## 2020-02-17 LAB — CBC
HCT: 41.1 % (ref 39.0–52.0)
Hemoglobin: 14.8 g/dL (ref 13.0–17.0)
MCH: 30.7 pg (ref 26.0–34.0)
MCHC: 36 g/dL (ref 30.0–36.0)
MCV: 85.3 fL (ref 80.0–100.0)
Platelets: 261 10*3/uL (ref 150–400)
RBC: 4.82 MIL/uL (ref 4.22–5.81)
RDW: 12.4 % (ref 11.5–15.5)
WBC: 13.1 10*3/uL — ABNORMAL HIGH (ref 4.0–10.5)
nRBC: 0 % (ref 0.0–0.2)

## 2020-02-17 LAB — LIPASE, BLOOD: Lipase: 25 U/L (ref 11–51)

## 2020-02-17 LAB — RESPIRATORY PANEL BY RT PCR (FLU A&B, COVID)
Influenza A by PCR: NEGATIVE
Influenza B by PCR: NEGATIVE
SARS Coronavirus 2 by RT PCR: NEGATIVE

## 2020-02-17 IMAGING — CT CT ABD-PELV W/ CM
2 of 4 series · 16 of 46 positions shown, 18 images · IV contrast (omnipaque)
Comparison: None.

CLINICAL DATA: Abdominal pain

EXAM:
CT ABDOMEN AND PELVIS WITH CONTRAST
TECHNIQUE: Multidetector CT imaging of the abdomen and pelvis was performed
using the standard protocol following bolus administration of
intravenous contrast.
CONTRAST:  125mL OMNIPAQUE IOHEXOL 300 MG/ML  SOLN

[Series 2: routine abd/pel with · axial · 0.91mm/px · z∈[-851,-381]mm · 13 of 104 slices shown, 15 images]
[im 5/104  soft-tissue]
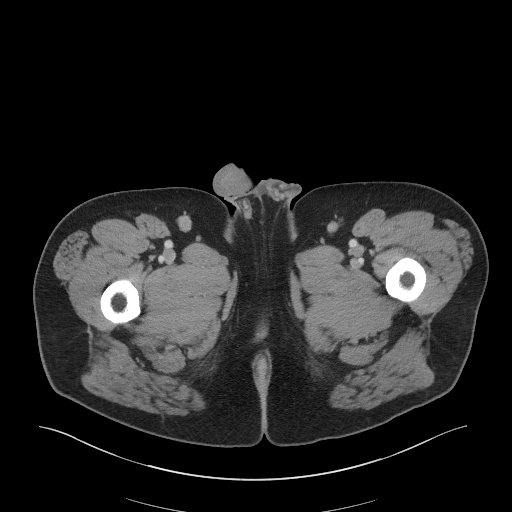
[im 5/104  bone]
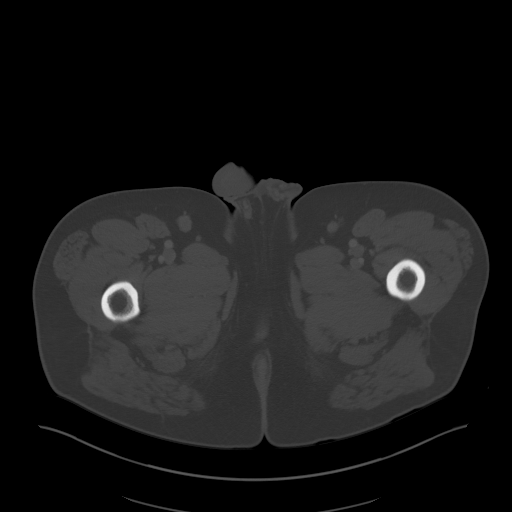
[im 13/104  soft-tissue]
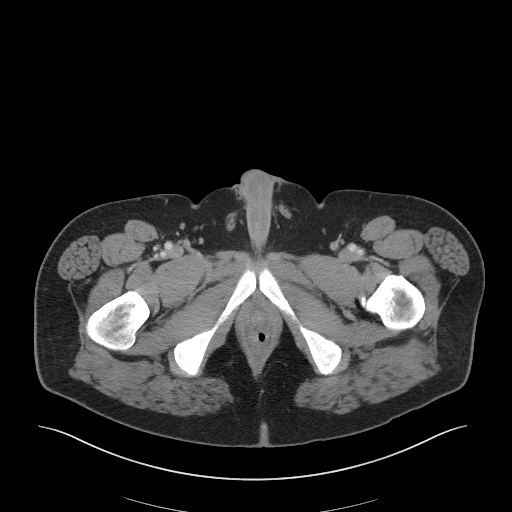
[im 21/104  soft-tissue]
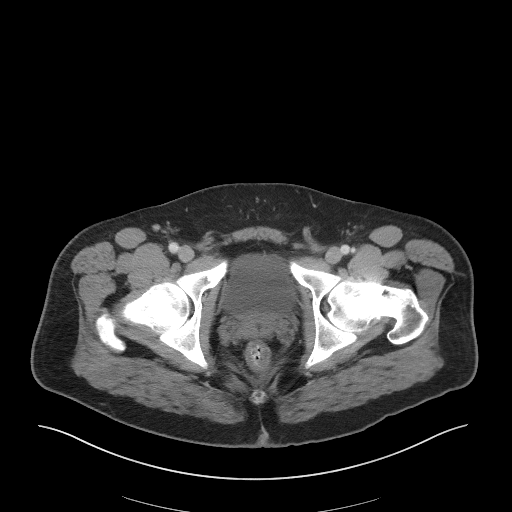
[im 29/104  soft-tissue]
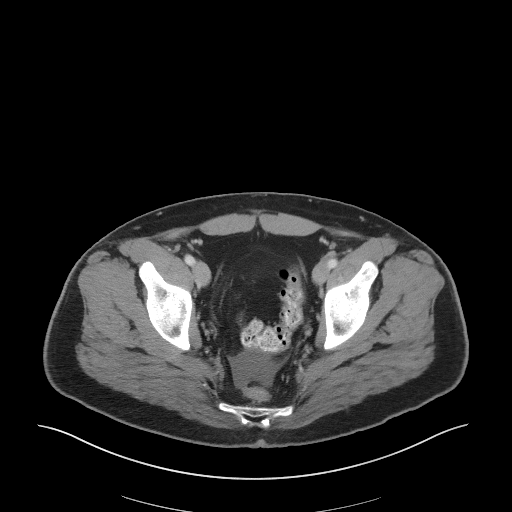
[im 38/104  soft-tissue]
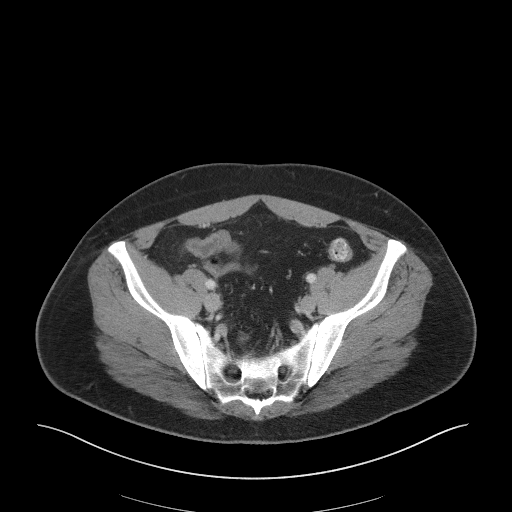
[im 46/104  soft-tissue]
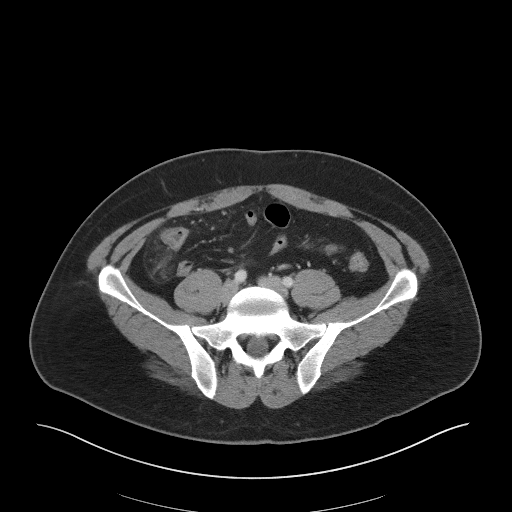
[im 54/104  soft-tissue]
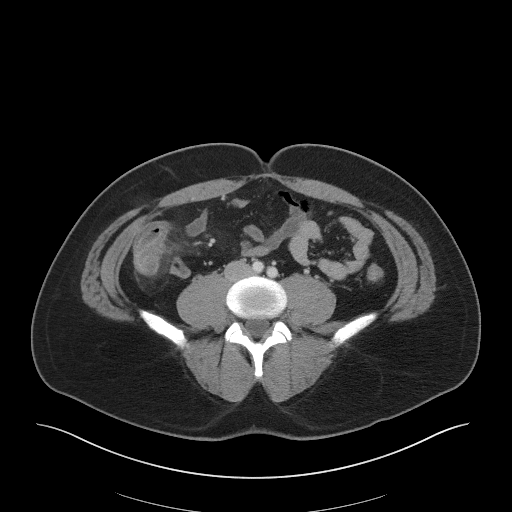
[im 58/104  soft-tissue]
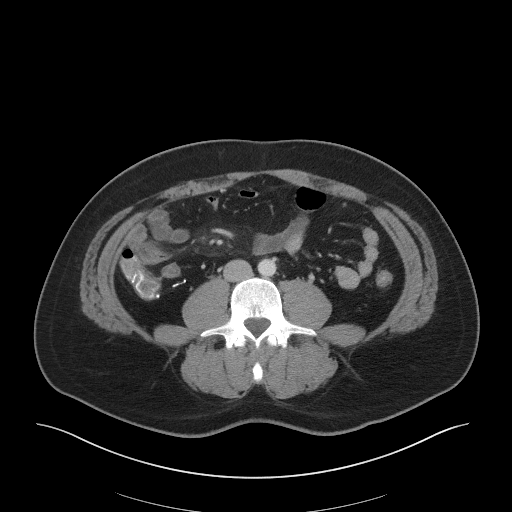
[im 66/104  soft-tissue]
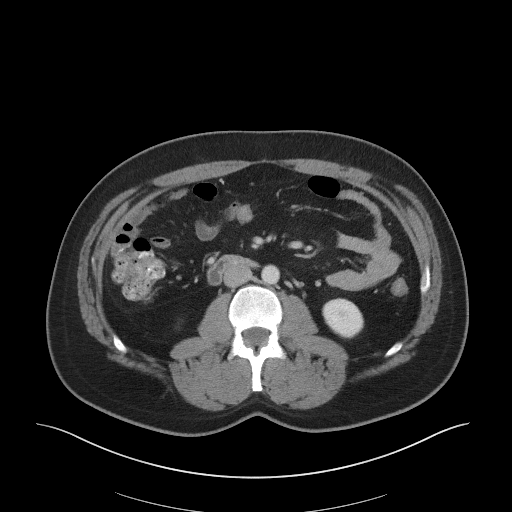
[im 66/104  bone]
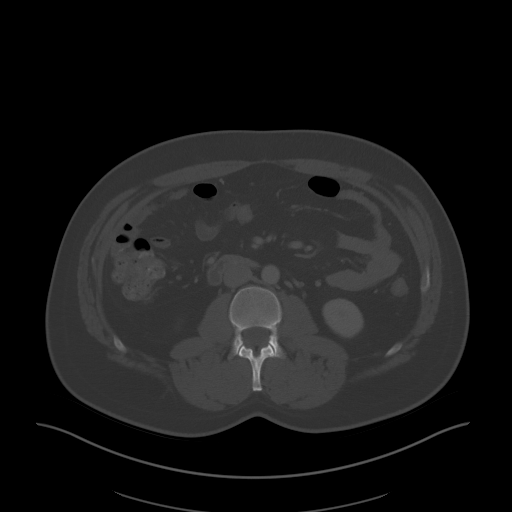
[im 75/104  soft-tissue]
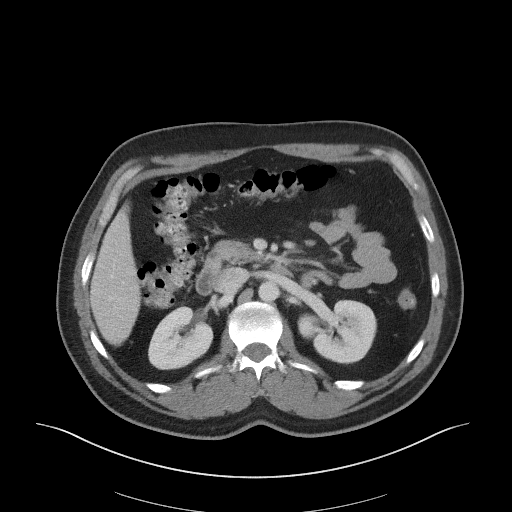
[im 83/104  soft-tissue]
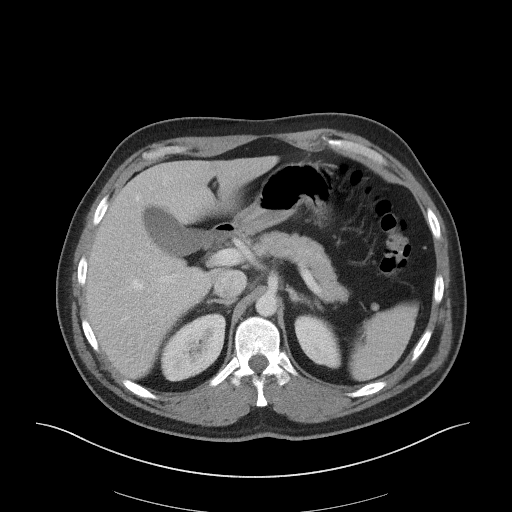
[im 91/104  soft-tissue]
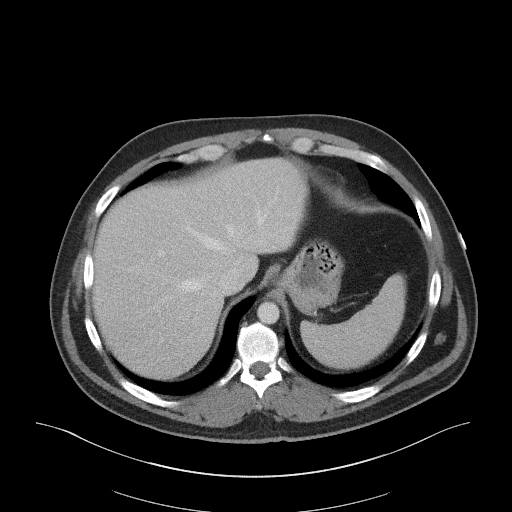
[im 99/104  soft-tissue]
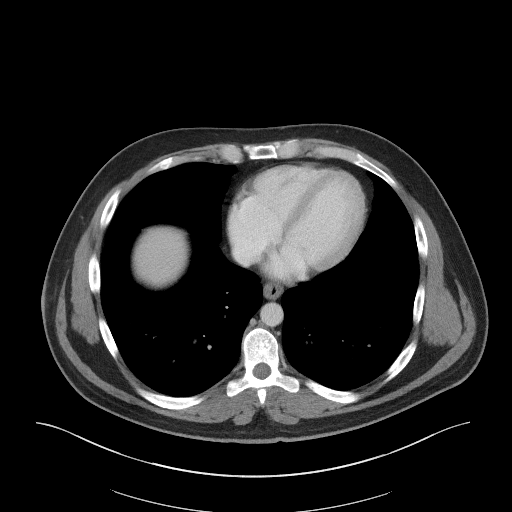

[Series 5: coronal st · coronal · 0.83mm/px · 3 of 94 slices shown]
[im 32/94  soft-tissue]
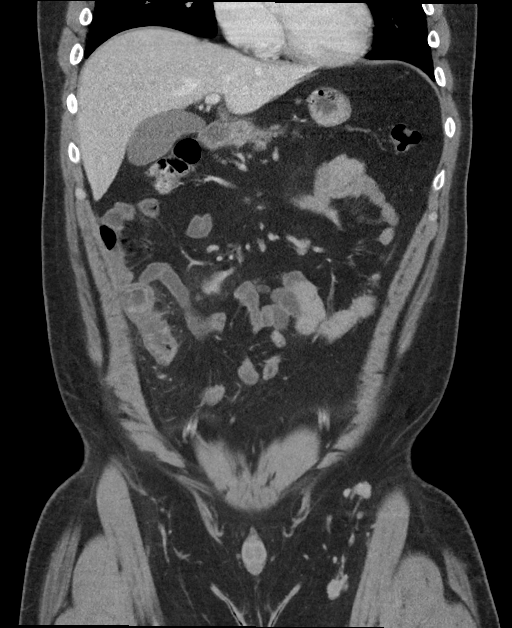
[im 42/94  soft-tissue]
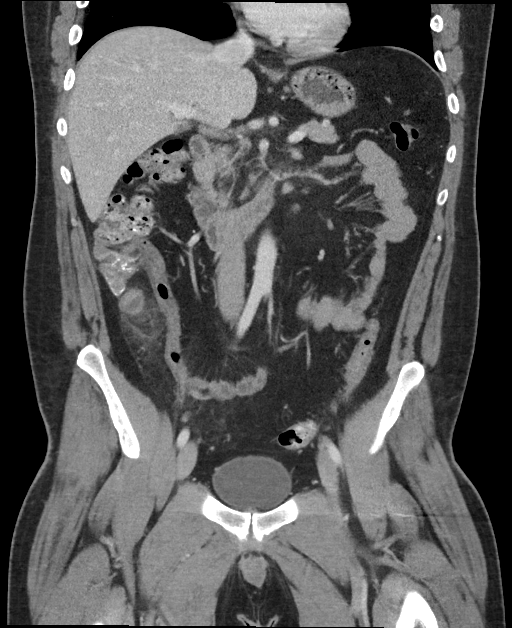
[im 52/94  soft-tissue]
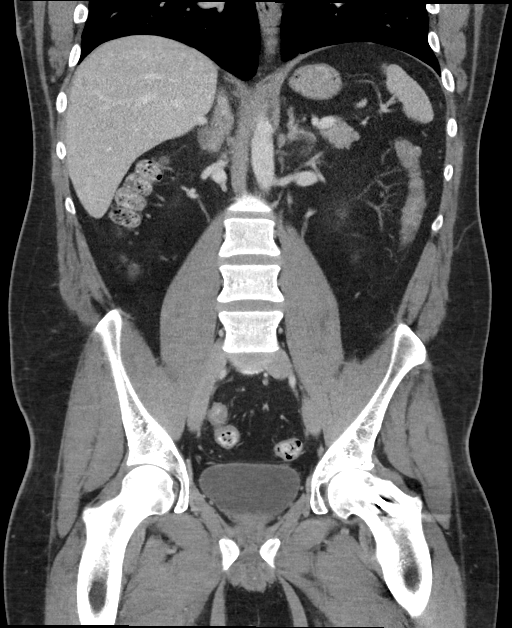

[16 of 46 positions shown; findings below may reference images not displayed]

FINDINGS: Lower chest: No acute abnormality.  Bibasilar atelectasis.

Hepatobiliary: Focal fatty deposition adjacent to the falciform
ligament. Portal and hepatic veins are patent. Gallbladder is
unremarkable. No intrahepatic or extrahepatic biliary ductal
dilation.

Pancreas: Unremarkable. No pancreatic ductal dilatation or
surrounding inflammatory changes.

Spleen: Normal in size without focal abnormality.  Splenules.

Adrenals/Urinary Tract: Adrenal glands are unremarkable.
Nonobstructive LEFT-sided nephrolithiasis. Kidneys enhance
symmetrically. Bladder is unremarkable.

Stomach/Bowel: There is segmental bowel wall thickening and mucosal
enhancement within a loop of small bowel in the RIGHT lower quadrant
which is closely adherent to the cecum and terminal ileum. There is
robust adjacent fat stranding. There are several punctate foci of
air which are not definitively intraluminal in location (series 5,
image 41). There is apparent focal wall defect in this region. The
terminal ileum is diffusely thick walled. There is apparent
tethering of the mesentery in the RIGHT lower quadrant with creeping
fat extending from bowel wall thickening of loops of small bowel in
this region. Status post appendectomy. No evidence of bowel
obstruction.

Vascular/Lymphatic: Aorta is normal in appearance. Prominent
mesenteric lymph nodes with representative lymph node measuring 9 mm
(series 2, image 45).

Reproductive: Prostate is present.

Other: Small volume free fluid in the pelvis.

Musculoskeletal: Status post LEFT femoral medullary nail.
IMPRESSION: 1. Constellation of findings are concerning for focal RIGHT lower
quadrant small bowel perforation in the setting of inflammatory
bowel disease. No focal organized drainable fluid collection.
2. Small volume free fluid in the pelvis.

These results were called by telephone at the time of interpretation
on [DATE] at [DATE] to provider FATONAH , who verbally
acknowledged these results.

## 2020-02-17 MED ORDER — ONDANSETRON HCL 4 MG/2ML IJ SOLN
4.0000 mg | Freq: Four times a day (QID) | INTRAMUSCULAR | Status: DC | PRN
  Administered 2020-02-17: 4 mg via INTRAVENOUS
  Filled 2020-02-17: qty 2

## 2020-02-17 MED ORDER — PANTOPRAZOLE SODIUM 40 MG IV SOLR
40.0000 mg | INTRAVENOUS | Status: DC
  Administered 2020-02-17 – 2020-02-19 (×3): 40 mg via INTRAVENOUS
  Filled 2020-02-17 (×3): qty 40

## 2020-02-17 MED ORDER — PIPERACILLIN-TAZOBACTAM 3.375 G IVPB 30 MIN
3.3750 g | Freq: Once | INTRAVENOUS | Status: AC
  Administered 2020-02-17: 3.375 g via INTRAVENOUS
  Filled 2020-02-17: qty 50

## 2020-02-17 MED ORDER — ONDANSETRON HCL 4 MG PO TABS: 4.0000 mg | ORAL_TABLET | Freq: Four times a day (QID) | ORAL | Status: DC | PRN

## 2020-02-17 MED ORDER — PIPERACILLIN-TAZOBACTAM 3.375 G IVPB
3.3750 g | Freq: Three times a day (TID) | INTRAVENOUS | Status: DC
  Administered 2020-02-17 – 2020-02-20 (×8): 3.375 g via INTRAVENOUS
  Filled 2020-02-17 (×8): qty 50

## 2020-02-17 MED ORDER — IOHEXOL 300 MG/ML  SOLN
125.0000 mL | Freq: Once | INTRAMUSCULAR | Status: AC | PRN
  Administered 2020-02-17: 125 mL via INTRAVENOUS
  Filled 2020-02-17: qty 125

## 2020-02-17 MED ORDER — FLUTICASONE PROPIONATE 50 MCG/ACT NA SUSP
2.0000 | Freq: Two times a day (BID) | NASAL | Status: DC | PRN
  Filled 2020-02-17: qty 16

## 2020-02-17 MED ORDER — HYDRALAZINE HCL 20 MG/ML IJ SOLN: 10.0000 mg | Freq: Four times a day (QID) | INTRAMUSCULAR | Status: DC | PRN

## 2020-02-17 MED ORDER — DEXTROSE-NACL 5-0.45 % IV SOLN: INTRAVENOUS | Status: DC

## 2020-02-17 MED ORDER — MORPHINE SULFATE (PF) 2 MG/ML IV SOLN
2.0000 mg | INTRAVENOUS | Status: DC | PRN
  Administered 2020-02-17 – 2020-02-18 (×3): 2 mg via INTRAVENOUS
  Filled 2020-02-17 (×3): qty 1

## 2020-02-17 MED ORDER — DROPERIDOL 2.5 MG/ML IJ SOLN
2.5000 mg | Freq: Once | INTRAMUSCULAR | Status: AC
  Administered 2020-02-17: 2.5 mg via INTRAVENOUS
  Filled 2020-02-17: qty 2

## 2020-02-17 MED ORDER — ENOXAPARIN SODIUM 40 MG/0.4ML ~~LOC~~ SOLN
40.0000 mg | SUBCUTANEOUS | Status: DC
  Administered 2020-02-17 – 2020-02-19 (×3): 40 mg via SUBCUTANEOUS
  Filled 2020-02-17 (×3): qty 0.4

## 2020-02-17 NOTE — H&P (Signed)
History and Physical    Gordon Carson [id number] DOB: Sep 07, 1980 DOA: 02/17/2020  PCP: Valerie Roys, DO   Patient coming from: Home  I have personally briefly reviewed patient's old medical records in [place]  Chief Complaint: Abdominal pain  HPI: Gordon Carson is a 39 y.o. male with medical history significant for hypertension and anxiety disorder who presents to the emergency room for evaluation of abdominal pain mostly in the periumbilical area and right lower quadrant associated with anorexia, bloating and loose watery stools which he has had for about 5 days.  He rates his pain a 5 x 10 in intensity at its worst, it is nonradiating and is steady pain without any known relieving or aggravating factors.  His oral intake has been poor due to this abdominal pain but he denies having any postprandial worsening of his pain.  He has had chills but denies having any fever. Patient states that he recently performed a dietary cleanse with 4 days of celery juice and other cleansers last week. He denies having any hematochezia, no melena stools or hematemesis.  He denies shortness of breath, no urinary symptoms, no chest pain, no dizziness or lightheadedness. Labs show sodium 133, potassium 3.8, chloride 98, bicarb 25, BUN 10, creatinine 0.97, alkaline phosphatase 58, albumin 4.3, AST 14, ALT 18, white count 13.1, hemoglobin 14.8, hematocrit 41.1, MCV 85.3, RDW 12.4, platelet count 261 CT scan of abdomen and pelvis shows constellation of findings that are concerning for focal RIGHT lower quadrant small bowel perforation in the setting of inflammatory bowel disease. No focal organized drainable fluid collection. Small volume free fluid in the pelvis. Twelve-lead EKG reviewed by me shows normal sinus rhythm   ED Course: Patient is a 39 year old Caucasian male with a history of hypertension and anxiety who presents to the ER for evaluation of abdominal pain mostly in the  periumbilical right lower quadrant area.  Abdominal pain is associated with anorexia and diarrhea.  Patient had a CT scan of abdomen and pelvis done in the emergency room which showed a focal right lower quadrant small bowel perforation in the setting of inflammatory bowel disease.  Surgery was consulted but requested admission to medicine.  Patient will be admitted to the hospital for further evaluation  Review of Systems: As per HPI otherwise 10 point review of systems negative.    Past Medical History:  Diagnosis Date  . Anxiety   . Hypertension   . Slipped epiphysis     Past Surgical History:  Procedure Laterality Date  . APPENDECTOMY  2004  . HIP SURGERY  1998   pin placed     reports that he has never smoked. He has never used smokeless tobacco. He reports that he does not drink alcohol and does not use drugs.  Allergies  Allergen Reactions  . Codeine Nausea And Vomiting    Family History  Problem Relation Age of Onset  . Cancer Mother        breast  . Hypertension Maternal Grandfather   . Stroke Maternal Grandfather   . Heart disease Maternal Grandfather      Prior to Admission medications   Medication Sig Start Date End Date Taking? Authorizing Provider  amLODipine (NORVASC) 10 MG tablet Take 1 tablet (10 mg total) by mouth daily. 08/07/19   Johnson, Megan P, DO  fluticasone (FLONASE) 50 MCG/ACT nasal spray Place 2 sprays into both nostrils 2 (two) times daily as needed for allergies or rhinitis. 02/27/19  Volney American, PA-C  lisinopril (ZESTRIL) 5 MG tablet TAKE 1 TABLET(5 MG) BY MOUTH DAILY 08/07/19   Valerie Roys, DO    Physical Exam: Vitals:   02/17/20 1339 02/17/20 1341 02/17/20 1343 02/17/20 1715  BP:   125/81 125/83  Pulse: 75   84  Resp: 16   16  Temp: 98.9 F (37.2 C)     TempSrc: Oral     SpO2: 100%   98%  Weight:  104.3 kg    Height:  6\' 1"  (1.854 m)       Vitals:   02/17/20 1339 02/17/20 1341 02/17/20 1343 02/17/20 1715  BP:    125/81 125/83  Pulse: 75   84  Resp: 16   16  Temp: 98.9 F (37.2 C)     TempSrc: Oral     SpO2: 100%   98%  Weight:  104.3 kg    Height:  6\' 1"  (1.854 m)      Constitutional: NAD, alert and oriented x 3.  Nontoxic-appearing Eyes: PERRL, lids and conjunctivae normal ENMT: Mucous membranes are moist.  Neck: normal, supple, no masses, no thyromegaly Respiratory: clear to auscultation bilaterally, no wheezing, no crackles. Normal respiratory effort. No accessory muscle use.  Cardiovascular: Regular rate and rhythm, no murmurs / rubs / gallops. No extremity edema. 2+ pedal pulses. No carotid bruits.  Abdomen:  tenderness in the right lower quadrant, no masses palpated. No hepatosplenomegaly. Bowel sounds positive.  Musculoskeletal: no clubbing / cyanosis. No joint deformity upper and lower extremities.  Skin: no rashes, lesions, ulcers.  Neurologic: No gross focal neurologic deficit. Psychiatric: Normal mood and affect.   Labs on Admission: I have personally reviewed following labs and imaging studies  CBC: Recent Labs  Lab 02/17/20 1351  WBC 13.1*  HGB 14.8  HCT 41.1  MCV 85.3  PLT 466   Basic Metabolic Panel: Recent Labs  Lab 02/17/20 1351  NA 133*  K 3.8  CL 98  CO2 25  GLUCOSE 91  BUN 10  CREATININE 0.97  CALCIUM 9.0   GFR: Estimated Creatinine Clearance: 131 mL/min (by C-G formula based on SCr of 0.97 mg/dL). Liver Function Tests: Recent Labs  Lab 02/17/20 1351  AST 14*  ALT 18  ALKPHOS 58  BILITOT 1.2  PROT 7.9  ALBUMIN 4.3   Recent Labs  Lab 02/17/20 1351  LIPASE 25   No results for input(s): AMMONIA in the last 168 hours. Coagulation Profile: No results for input(s): INR, PROTIME in the last 168 hours. Cardiac Enzymes: No results for input(s): CKTOTAL, CKMB, CKMBINDEX, TROPONINI in the last 168 hours. BNP (last 3 results) No results for input(s): PROBNP in the last 8760 hours. HbA1C: No results for input(s): HGBA1C in the last 72  hours. CBG: No results for input(s): GLUCAP in the last 168 hours. Lipid Profile: No results for input(s): CHOL, HDL, LDLCALC, TRIG, CHOLHDL, LDLDIRECT in the last 72 hours. Thyroid Function Tests: No results for input(s): TSH, T4TOTAL, FREET4, T3FREE, THYROIDAB in the last 72 hours. Anemia Panel: No results for input(s): VITAMINB12, FOLATE, FERRITIN, TIBC, IRON, RETICCTPCT in the last 72 hours. Urine analysis:    Component Value Date/Time   COLORURINE YELLOW (A) 02/17/2020 1351   APPEARANCEUR CLEAR (A) 02/17/2020 1351   APPEARANCEUR Clear 06/20/2018 0823   LABSPEC 1.013 02/17/2020 1351   PHURINE 6.0 02/17/2020 1351   GLUCOSEU NEGATIVE 02/17/2020 1351   HGBUR MODERATE (A) 02/17/2020 1351   BILIRUBINUR NEGATIVE 02/17/2020 1351   BILIRUBINUR Negative  06/20/2018 0823   KETONESUR 20 (A) 02/17/2020 1351   PROTEINUR NEGATIVE 02/17/2020 1351   NITRITE NEGATIVE 02/17/2020 1351   LEUKOCYTESUR SMALL (A) 02/17/2020 1351    Radiological Exams on Admission: CT ABDOMEN PELVIS W CONTRAST  Result Date: 02/17/2020 CLINICAL DATA:  Abdominal pain EXAM: CT ABDOMEN AND PELVIS WITH CONTRAST TECHNIQUE: Multidetector CT imaging of the abdomen and pelvis was performed using the standard protocol following bolus administration of intravenous contrast. CONTRAST:  132mL OMNIPAQUE IOHEXOL 300 MG/ML  SOLN COMPARISON:  None. FINDINGS: Lower chest: No acute abnormality.  Bibasilar atelectasis. Hepatobiliary: Focal fatty deposition adjacent to the falciform ligament. Portal and hepatic veins are patent. Gallbladder is unremarkable. No intrahepatic or extrahepatic biliary ductal dilation. Pancreas: Unremarkable. No pancreatic ductal dilatation or surrounding inflammatory changes. Spleen: Normal in size without focal abnormality.  Splenules. Adrenals/Urinary Tract: Adrenal glands are unremarkable. Nonobstructive LEFT-sided nephrolithiasis. Kidneys enhance symmetrically. Bladder is unremarkable. Stomach/Bowel: There is  segmental bowel wall thickening and mucosal enhancement within a loop of small bowel in the RIGHT lower quadrant which is closely adherent to the cecum and terminal ileum. There is robust adjacent fat stranding. There are several punctate foci of air which are not definitively intraluminal in location (series 5, image 41). There is apparent focal wall defect in this region. The terminal ileum is diffusely thick walled. There is apparent tethering of the mesentery in the RIGHT lower quadrant with creeping fat extending from bowel wall thickening of loops of small bowel in this region. Status post appendectomy. No evidence of bowel obstruction. Vascular/Lymphatic: Aorta is normal in appearance. Prominent mesenteric lymph nodes with representative lymph node measuring 9 mm (series 2, image 45). Reproductive: Prostate is present. Other: Small volume free fluid in the pelvis. Musculoskeletal: Status post LEFT femoral medullary nail. IMPRESSION: 1. Constellation of findings are concerning for focal RIGHT lower quadrant small bowel perforation in the setting of inflammatory bowel disease. No focal organized drainable fluid collection. 2. Small volume free fluid in the pelvis. These results were called by telephone at the time of interpretation on 02/17/2020 at 4:30 pm to provider [facility] , who verbally acknowledged these results. Electronically Signed   By: Valentino Saxon MD   On: 02/17/2020 16:33    EKG: Independently reviewed.  Normal sinus rhythm   Assessment/Plan Principal Problem:   Inflammatory bowel disease Active Problems:   Hypertension    Inflammatory bowel disease (New onset) With contained perforation involving the small bowel Place patient on IV Zosyn adjusted to renal function Pain control with IV morphine Keep patient n.p.o. for now Supportive care with IV fluid hydration and antiemetics We will request GI and surgical consult   Hypertension Hold oral antihypertensive  medications Place patient on IV hydralazine as needed for systolic blood pressure greater than115mmHg  DVT prophylaxis: Lovenox Code Status: Full code Family Communication: Greater than 50% of time was spent discussing plan of care with patient and his wife at the bedside.  All questions and concerns have been addressed.  They verbalized understanding and agree with the plan. Disposition Plan: Back to previous home environment Consults called: Gastroenterology/surgery    Collier Bullock MD [facility]     02/17/2020, 5:27 PM

## 2020-02-17 NOTE — Progress Notes (Signed)
39 yo male w subacute abdominal pain, CT pers. reviewed and d/.w radiologist. Microperforation ( two small bubbles) from chron's, significant inflammatory response TI . No overt free air, non toxic and not peritonitic . Recommend IVF resuscitation, Zosyn and NPO, serial abdominal exams.  We will attempt to manage non operatively. HE will also benefit  From GI consult. Full consult will follow.

## 2020-02-17 NOTE — ED Triage Notes (Signed)
Pt arrives via pov ambulatory to triage c/o abd pain starting Thursday night. Pt states [facility] to [facility] and was sent here. Pt reports RUQ pain, tender with palpation at urgent care. Report diarrhea since Friday. Pt states he taking pepto and stool turned black in color for couple days but back to normal color. Denies and n/v. NAD noted at this time

## 2020-02-17 NOTE — ED Notes (Signed)
Pt c/o of R sided and central abd pain since Thursday. Diarrhea since Friday. States started a cleanse last week. Started eating food again on Thursday. Denies vomiting or fever.   A&O, ambulatory. No distress noted.

## 2020-02-17 NOTE — Telephone Encounter (Signed)
Noted  

## 2020-02-17 NOTE — Progress Notes (Signed)
Pharmacy Antibiotic Note  Gordon Carson is a 39 y.o. male admitted on 02/17/2020. Pharmacy has been consulted for Zosyn dosing for intra-abdominal infection.  Plan: Zosyn 3.375 g IV q8h extended infusion  Height: 6\' 1"  (185.4 cm) Weight: 104.3 kg (230 lb) IBW/kg (Calculated) : 79.9  Temp (24hrs), Avg:98.9 F (37.2 C), Min:98.9 F (37.2 C), Max:98.9 F (37.2 C)  Recent Labs  Lab 02/17/20 1351  WBC 13.1*  CREATININE 0.97    Estimated Creatinine Clearance: 131 mL/min (by C-G formula based on SCr of 0.97 mg/dL).    Allergies  Allergen Reactions  . Codeine Nausea And Vomiting    Antimicrobials this admission: Zosyn 10/5 >>    Thank you for allowing pharmacy to be a part of this patient's care.  Tawnya Crook, PharmD 02/17/2020 5:39 PM

## 2020-02-17 NOTE — Telephone Encounter (Signed)
Pt. Called back. States he went to [facility] and "they told me I need lab work and maybe an ultrasound." No availability in the practice until next week per [place]. She recommends pt. Go to [facility] in [place]. Pt. Verbalizes understanding.

## 2020-02-17 NOTE — ED Provider Notes (Signed)
[facility] Emergency Department Provider Note ____________________________________________   First MD Initiated Contact with Patient 02/17/20 1440     (approximate)  I have reviewed the triage vital signs and the nursing notes.  HISTORY  Chief Complaint Abdominal Pain   HPI Gordon Carson is a 39 y.o. malewho presents to the ED for evaluation of abd pain.   Chart review indicates hx HTN, anxiety.   Patient reports recently performing a "dietary cleanse" with 4 days of celery juice and other "cleansers" last week.  This was on 9/27-30.  During the latter 2 days of this, patient reports developing constant epigastric aching pain that has been present for the past 4-5 days constantly.  He reports the pain is initially to his epigastrium, but is now periumbilical, is 2/33 intensity and unremitting.  Patient reports he has been experiencing this pain for the past 4 days constantly without any pain-free moments.  He reports he is able to sleep and tolerate p.o. intake without postprandial worsening of his pain, postprandial vomiting.  He denies diarrhea or stool changes.  He reports for the past 1-2 days, he has had decreased intake of food.  His wife adds that he has definitely eaten less for the past 2 days and said the pain may have been worsening over the past 2 days.  Patient reports the same 4/10 intensity pain here.   Denies fevers, syncope, chest pain, shortness of breath, dysuria, hematuria, hematochezia, melena, hematemesis. He reports trying to take OTC Pepto-Bismol, without changes to his symptoms, but did develop dark-colored stools after this.  Denies regular NSAID use or history of upper GI bleed.  Of note, patient reports a "remarkable" amount of stress from his job and family over the same timeframe of his abdominal pain. We discussed the possibility of stress contributing to his symptoms, and shared decision-making about the utility of CT imaging  for his symptoms versus empiric treatment and reassessment.  He reports, and his wife agrees, that he would prefer CT imaging and is very eager to get this imaging.  Past Medical History:  Diagnosis Date  . Anxiety   . Hypertension   . Slipped epiphysis     Patient Active Problem List   Diagnosis Date Noted  . Inflammatory bowel disease 02/17/2020  . Hypertension   . Controlled substance agreement signed 06/22/2015  . Palpitations 05/07/2015  . Acute anxiety 11/02/2014  . Benign hypertensive renal disease 10/14/2014  . Obesity 10/14/2014  . Allergic rhinitis 10/14/2014  . History of ADHD 10/14/2014  . Elevated cholesterol 10/14/2014    Past Surgical History:  Procedure Laterality Date  . APPENDECTOMY  2004  . HIP SURGERY  1998   pin placed    Prior to Admission medications   Medication Sig Start Date End Date Taking? Authorizing Provider  amLODipine (NORVASC) 10 MG tablet Take 1 tablet (10 mg total) by mouth daily. 08/07/19   Johnson, Megan P, DO  fluticasone (FLONASE) 50 MCG/ACT nasal spray Place 2 sprays into both nostrils 2 (two) times daily as needed for allergies or rhinitis. 02/27/19   Volney American, PA-C  lisinopril (ZESTRIL) 5 MG tablet TAKE 1 TABLET(5 MG) BY MOUTH DAILY 08/07/19   Park Liter P, DO    Allergies Codeine  Family History  Problem Relation Age of Onset  . Cancer Mother        breast  . Hypertension Maternal Grandfather   . Stroke Maternal Grandfather   . Heart disease Maternal Grandfather  Social History Social History   Tobacco Use  . Smoking status: Never Smoker  . Smokeless tobacco: Never Used  Vaping Use  . Vaping Use: Never used  Substance Use Topics  . Alcohol use: No    Alcohol/week: 0.0 standard drinks  . Drug use: No    Review of Systems  Constitutional: No fever/chills Eyes: No visual changes. ENT: No sore throat. Cardiovascular: Denies chest pain. Respiratory: Denies shortness of  breath. Gastrointestinal:   No nausea, no vomiting.  No diarrhea.  No constipation.  Positive for abdominal pain. Genitourinary: Negative for dysuria. Musculoskeletal: Negative for back pain. Skin: Negative for rash. Neurological: Negative for headaches, focal weakness or numbness.  ____________________________________________   PHYSICAL EXAM:  VITAL SIGNS: Vitals:   02/17/20 1343 02/17/20 1715  BP: 125/81 125/83  Pulse:  84  Resp:  16  Temp:    SpO2:  98%      Constitutional: Alert and oriented. Well appearing and in no acute distress. Eyes: Conjunctivae are normal. PERRL. EOMI. Head: Atraumatic. Nose: No congestion/rhinnorhea. Mouth/Throat: Mucous membranes are moist.  Oropharynx non-erythematous. Neck: No stridor. No cervical spine tenderness to palpation. Cardiovascular: Normal rate, regular rhythm. Grossly normal heart sounds.  Good peripheral circulation. Respiratory: Normal respiratory effort.  No retractions. Lungs CTAB. Gastrointestinal: Soft , nondistended. No abdominal bruits. No CVA tenderness. Mild and diffuse tenderness to palpation without localizing peritoneal features. Musculoskeletal: No lower extremity tenderness nor edema.  No joint effusions. No signs of acute trauma. Neurologic:  Normal speech and language. No gross focal neurologic deficits are appreciated. No gait instability noted. Skin:  Skin is warm, dry and intact. No rash noted. Psychiatric: Mood and affect are normal. Speech and behavior are normal.  ____________________________________________   LABS (all labs ordered are listed, but only abnormal results are displayed)  Labs Reviewed  COMPREHENSIVE METABOLIC PANEL - Abnormal; Notable for the following components:      Result Value   Sodium 133 (*)    AST 14 (*)    All other components within normal limits  CBC - Abnormal; Notable for the following components:   WBC 13.1 (*)    All other components within normal limits  URINALYSIS,  COMPLETE (UACMP) WITH MICROSCOPIC - Abnormal; Notable for the following components:   Color, Urine YELLOW (*)    APPearance CLEAR (*)    Hgb urine dipstick MODERATE (*)    Ketones, ur 20 (*)    Leukocytes,Ua SMALL (*)    All other components within normal limits  RESPIRATORY PANEL BY RT PCR (FLU A&B, COVID)  LIPASE, BLOOD  HIV ANTIBODY (ROUTINE TESTING W REFLEX)  BASIC METABOLIC PANEL  CBC   ____________________________________________  12 Lead EKG  Rate of 81 bpm.  Normal axis and intervals.  No evidence of acute ischemia.  Normal EKG. ____________________________________________  RADIOLOGY  ED MD interpretation:    Official radiology report(s): CT ABDOMEN PELVIS W CONTRAST  Result Date: 02/17/2020 CLINICAL DATA:  Abdominal pain EXAM: CT ABDOMEN AND PELVIS WITH CONTRAST TECHNIQUE: Multidetector CT imaging of the abdomen and pelvis was performed using the standard protocol following bolus administration of intravenous contrast. CONTRAST:  149mL OMNIPAQUE IOHEXOL 300 MG/ML  SOLN COMPARISON:  None. FINDINGS: Lower chest: No acute abnormality.  Bibasilar atelectasis. Hepatobiliary: Focal fatty deposition adjacent to the falciform ligament. Portal and hepatic veins are patent. Gallbladder is unremarkable. No intrahepatic or extrahepatic biliary ductal dilation. Pancreas: Unremarkable. No pancreatic ductal dilatation or surrounding inflammatory changes. Spleen: Normal in size without focal abnormality.  Splenules. Adrenals/Urinary Tract: Adrenal glands are unremarkable. Nonobstructive LEFT-sided nephrolithiasis. Kidneys enhance symmetrically. Bladder is unremarkable. Stomach/Bowel: There is segmental bowel wall thickening and mucosal enhancement within a loop of small bowel in the RIGHT lower quadrant which is closely adherent to the cecum and terminal ileum. There is robust adjacent fat stranding. There are several punctate foci of air which are not definitively intraluminal in location  (series 5, image 41). There is apparent focal wall defect in this region. The terminal ileum is diffusely thick walled. There is apparent tethering of the mesentery in the RIGHT lower quadrant with creeping fat extending from bowel wall thickening of loops of small bowel in this region. Status post appendectomy. No evidence of bowel obstruction. Vascular/Lymphatic: Aorta is normal in appearance. Prominent mesenteric lymph nodes with representative lymph node measuring 9 mm (series 2, image 45). Reproductive: Prostate is present. Other: Small volume free fluid in the pelvis. Musculoskeletal: Status post LEFT femoral medullary nail. IMPRESSION: 1. Constellation of findings are concerning for focal RIGHT lower quadrant small bowel perforation in the setting of inflammatory bowel disease. No focal organized drainable fluid collection. 2. Small volume free fluid in the pelvis. These results were called by telephone at the time of interpretation on 02/17/2020 at 4:30 pm to provider [facility] , who verbally acknowledged these results. Electronically Signed   By: Valentino Saxon MD   On: 02/17/2020 16:33    ____________________________________________   PROCEDURES and INTERVENTIONS  Procedure(s) performed (including Critical Care):  Procedures  Medications  fluticasone (FLONASE) 50 MCG/ACT nasal spray 2 spray (has no administration in time range)  enoxaparin (LOVENOX) injection 40 mg (has no administration in time range)  dextrose 5 %-0.45 % sodium chloride infusion (has no administration in time range)  morphine 2 MG/ML injection 2 mg (2 mg Intravenous Given 02/17/20 1817)  ondansetron (ZOFRAN) tablet 4 mg ( Oral See Alternative 02/17/20 1816)    Or  ondansetron (ZOFRAN) injection 4 mg (4 mg Intravenous Given 02/17/20 1816)  hydrALAZINE (APRESOLINE) injection 10 mg (has no administration in time range)  pantoprazole (PROTONIX) injection 40 mg (has no administration in time range)   piperacillin-tazobactam (ZOSYN) IVPB 3.375 g (has no administration in time range)  droperidol (INAPSINE) 2.5 MG/ML injection 2.5 mg (2.5 mg Intravenous Given 02/17/20 1526)  iohexol (OMNIPAQUE) 300 MG/ML solution 125 mL (125 mLs Intravenous Contrast Given 02/17/20 1535)  piperacillin-tazobactam (ZOSYN) IVPB 3.375 g (0 g Intravenous Stopped 02/17/20 1808)    ____________________________________________   MDM / ED COURSE  39 year old male with history of HTN and anxiety presented ED with a few days of constant abdominal pain, found to have evidence of Crohn's disease as a new diagnosis with the complication of a contained intestinal perforation, requiring medical admission.  Normal vital signs on room air.  Exam demonstrates poorly localizing and mild pain to his periumbilical and right-sided abdomen without peritoneal features.  He is otherwise well-appearing without evidence of acute pathology.  Blood work with leukocytosis to 13, otherwise unremarkable.  There was tenderness and leukocytosis, CT imaging obtained and with evidence of Crohn's disease, but also a contained perforation with few foci of air around terminal ileal inflammation.  Spoke with general surgery due to perforation, and they recommend medical admission due to seemingly contained perforation and his largely benign presentation without urgent surgical needs.  Recommend Zosyn and medical admission for serial exams and GI consultation.  Admitted to hospitalist medicine for further work-up and management.  Clinical Course as of Feb 17 1815  Tue Feb 17, 2020  1631 Call from rads, signs of crohn's disease and small perforation to the distal ileum   [DS]  1648 Educated patient and wife on CT findings and plan to discuss case with surgery   [DS]  1651 Discussed the case with Dr. Dahlia Byes, general surgery on-call.  He recommends Zosyn and medical admission with GI consultation   [DS]  1657 Discussed case with admitting hospitalist, who  requested I speak with GI in consultation.   [DS]  0459 She agrees to admit the patient. Dr. Allen Norris Paged.   [DS]    Clinical Course User Index [DS] Vladimir Crofts, MD     ____________________________________________   FINAL CLINICAL IMPRESSION(S) / ED DIAGNOSES  Final diagnoses:  Generalized abdominal pain  Crohn's disease of both small and large intestine with other complication [facility])  Intestinal perforation [facility])     ED Discharge Orders    None       Duaa Stelzner   Note:  This document was prepared using Dragon voice recognition software and may include unintentional dictation errors.   Vladimir Crofts, MD 02/17/20 [phone]

## 2020-02-17 NOTE — ED Notes (Signed)
Patient transported to CT 

## 2020-02-18 ENCOUNTER — Other Ambulatory Visit: Payer: Self-pay | Admitting: Family Medicine

## 2020-02-18 ENCOUNTER — Inpatient Hospital Stay: Payer: 59

## 2020-02-18 DIAGNOSIS — K529 Noninfective gastroenteritis and colitis, unspecified: Secondary | ICD-10-CM | POA: Diagnosis not present

## 2020-02-18 LAB — C-REACTIVE PROTEIN: CRP: 12.7 mg/dL — ABNORMAL HIGH (ref <1.0)

## 2020-02-18 LAB — BASIC METABOLIC PANEL
Anion gap: 11 (ref 5–15)
BUN: 9 mg/dL (ref 6–20)
CO2: 25 mmol/L (ref 22–32)
Calcium: 9 mg/dL (ref 8.9–10.3)
Chloride: 100 mmol/L (ref 98–111)
Creatinine, Ser: 1.13 mg/dL (ref 0.61–1.24)
GFR calc non Af Amer: >60 mL/min (ref >60)
Glucose, Bld: 98 mg/dL (ref 70–99)
Potassium: 3.5 mmol/L (ref 3.5–5.1)
Sodium: 136 mmol/L (ref 135–145)

## 2020-02-18 LAB — CBC
HCT: 39.1 % (ref 39.0–52.0)
Hemoglobin: 14.5 g/dL (ref 13.0–17.0)
MCH: 31.4 pg (ref 26.0–34.0)
MCHC: 37.1 g/dL — ABNORMAL HIGH (ref 30.0–36.0)
MCV: 84.6 fL (ref 80.0–100.0)
Platelets: 248 10*3/uL (ref 150–400)
RBC: 4.62 MIL/uL (ref 4.22–5.81)
RDW: 12.3 % (ref 11.5–15.5)
WBC: 10.5 10*3/uL (ref 4.0–10.5)
nRBC: 0 % (ref 0.0–0.2)

## 2020-02-18 LAB — HIV ANTIBODY (ROUTINE TESTING W REFLEX): HIV Screen 4th Generation wRfx: NONREACTIVE

## 2020-02-18 NOTE — Progress Notes (Signed)
PROGRESS NOTE    Gordon Carson  WRU:[id number] DOB: [birth year] DOA: 02/17/2020 PCP: Valerie Roys, DO   Chief Complain: Abdominal pain  Brief Narrative: Patient is a 39 year old male with history of hypertension, anxiety disorder who presents to the emergency department with complaints of abdominal pain mainly in the periumbilical and right lower quadrant associated with anorexia, bloating, loose watery stools for about 5 days.  He would have chills but no fever.  CT abdomen/pelvis done on presentation showed possible small bowel perforation on the right lower quadrant in the setting of inflammatory disease.  No history of Crohn's or ulcerative colitis in the past.  GI, general surgery consulted.  Started on antibiotics.  No plan for emergent surgery or colonoscopy.  Plan to repeat CT scan in 5 days.  Assessment & Plan:   Principal Problem:   Inflammatory bowel disease Active Problems:   Hypertension  Suspected inflammatory bowel disease/small bowel perforation: Presented with abdomen pain, bloating, chills.  No history of inflammatory bowel disease in the past.  Imagings were suggestive of possible perforation in RLQ/the small bowel.  Started on antibiotic, IV fluids, pain management. GI, general surgery consulted.No plan for emergent surgery or colonoscopy.  Plan to repeat CT scan in 5 days. We will plan to continue antibiotics for total of 2 weeks.  He does not complain of any abdomen pain this morning.  Passing gas but not stool. Started on clear liquids diet today  Hypertension: Currently blood pressure stable.  Continue monitor blood pressure.  Takes amlodipine and lisinopril at home.         DVT prophylaxis:Lovenox Code Status: Full Family Communication: Family member present at the bedside Status is: Inpatient  Remains inpatient appropriate because:Inpatient level of care appropriate due to severity of illness   Dispo: The patient is from: Home               Anticipated d/c is to: Home              Anticipated d/c date is: 3 days              Patient currently is not medically stable to d/c.        Consultants: GI, general surgery  Procedures: None  Antimicrobials:  Anti-infectives (From admission, onward)   Start     Dose/Rate Route Frequency Ordered Stop   02/17/20 2200  piperacillin-tazobactam (ZOSYN) IVPB 3.375 g        3.375 g 12.5 mL/hr over 240 Minutes Intravenous Every 8 hours 02/17/20 1738     02/17/20 1700  piperacillin-tazobactam (ZOSYN) IVPB 3.375 g        3.375 g 100 mL/hr over 30 Minutes Intravenous  Once 02/17/20 1649 02/17/20 1808      Subjective: Patient seen and examined the bedside this morning predominantly stable.  Comfortable.  Denies any abdominal pain.  Passing gas but not stool.  She has some pain on the right lower quadrant.  Objective: Vitals:   02/18/20 0019 02/18/20 0444 02/18/20 0737 02/18/20 1219  BP: 113/74 117/76 117/77 115/75  Pulse: 65 68 72 66  Resp: 16 18  15   Temp: 98.7 F (37.1 C) 98.4 F (36.9 C) 98.6 F (37 C) 99 F (37.2 C)  TempSrc: Oral Oral Oral Oral  SpO2: 96% 96% 98% 97%  Weight:      Height:        Intake/Output Summary (Last 24 hours) at 02/18/2020 1325 Last data filed at 02/18/2020 0912 Gross per 24  hour  Intake 0 ml  Output 500 ml  Net -500 ml   Filed Weights   02/17/20 1341  Weight: 104.3 kg    Examination:  General exam: Appears calm and comfortable ,Not in distress, obese HEENT:PERRL,Oral mucosa moist, Ear/Nose normal on gross exam Respiratory system: Bilateral equal air entry, normal vesicular breath sounds, no wheezes or crackles  Cardiovascular system: S1 & S2 heard, RRR. No JVD, murmurs, rubs, gallops or clicks. No pedal edema. Gastrointestinal system: Abdomen is  mildly distended, tenderness on the right lower quadrant normal bowel sounds heard. Central nervous system: Alert and oriented. No focal neurological deficits. Extremities: No edema, no  clubbing ,no cyanosis, distal peripheral pulses palpable. Skin: No rashes, lesions or ulcers,no icterus ,no pallor   Data Reviewed: I have personally reviewed following labs and imaging studies  CBC: Recent Labs  Lab 02/17/20 1351 02/18/20 0410  WBC 13.1* 10.5  HGB 14.8 14.5  HCT 41.1 39.1  MCV 85.3 84.6  PLT 261 962   Basic Metabolic Panel: Recent Labs  Lab 02/17/20 1351 02/18/20 0410  NA 133* 136  K 3.8 3.5  CL 98 100  CO2 25 25  GLUCOSE 91 98  BUN 10 9  CREATININE 0.97 1.13  CALCIUM 9.0 9.0   GFR: Estimated Creatinine Clearance: 112.5 mL/min (by C-G formula based on SCr of 1.13 mg/dL). Liver Function Tests: Recent Labs  Lab 02/17/20 1351  AST 14*  ALT 18  ALKPHOS 58  BILITOT 1.2  PROT 7.9  ALBUMIN 4.3   Recent Labs  Lab 02/17/20 1351  LIPASE 25   No results for input(s): AMMONIA in the last 168 hours. Coagulation Profile: No results for input(s): INR, PROTIME in the last 168 hours. Cardiac Enzymes: No results for input(s): CKTOTAL, CKMB, CKMBINDEX, TROPONINI in the last 168 hours. BNP (last 3 results) No results for input(s): PROBNP in the last 8760 hours. HbA1C: No results for input(s): HGBA1C in the last 72 hours. CBG: No results for input(s): GLUCAP in the last 168 hours. Lipid Profile: No results for input(s): CHOL, HDL, LDLCALC, TRIG, CHOLHDL, LDLDIRECT in the last 72 hours. Thyroid Function Tests: No results for input(s): TSH, T4TOTAL, FREET4, T3FREE, THYROIDAB in the last 72 hours. Anemia Panel: No results for input(s): VITAMINB12, FOLATE, FERRITIN, TIBC, IRON, RETICCTPCT in the last 72 hours. Sepsis Labs: No results for input(s): PROCALCITON, LATICACIDVEN in the last 168 hours.  Recent Results (from the past 240 hour(s))  Respiratory Panel by RT PCR (Flu A&B, Covid) - Nasopharyngeal Swab     Status: None   Collection Time: 02/17/20  5:14 PM   Specimen: Nasopharyngeal Swab  Result Value Ref Range Status   SARS Coronavirus 2 by RT PCR  NEGATIVE NEGATIVE Final    Comment: (NOTE) SARS-CoV-2 target nucleic acids are NOT DETECTED.  The SARS-CoV-2 RNA is generally detectable in upper respiratoy specimens during the acute phase of infection. The lowest concentration of SARS-CoV-2 viral copies this assay can detect is 131 copies/mL. A negative result does not preclude SARS-Cov-2 infection and should not be used as the sole basis for treatment or other patient management decisions. A negative result may occur with  improper specimen collection/handling, submission of specimen other than nasopharyngeal swab, presence of viral mutation(s) within the areas targeted by this assay, and inadequate number of viral copies (<131 copies/mL). A negative result must be combined with clinical observations, patient history, and epidemiological information. The expected result is Negative.  Fact Sheet for Patients:  PinkCheek.be  Fact Sheet  for Healthcare Providers:  GravelBags.it  This test is no t yet approved or cleared by the [place] and  has been authorized for detection and/or diagnosis of SARS-CoV-2 by FDA under an Emergency Use Authorization (EUA). This EUA will remain  in effect (meaning this test can be used) for the duration of the COVID-19 declaration under Section 564(b)(1) of the Act, 21 U.S.C. section 360bbb-3(b)(1), unless the authorization is terminated or revoked sooner.     Influenza A by PCR NEGATIVE NEGATIVE Final   Influenza B by PCR NEGATIVE NEGATIVE Final    Comment: (NOTE) The Xpert Xpress SARS-CoV-2/FLU/RSV assay is intended as an aid in  the diagnosis of influenza from Nasopharyngeal swab specimens and  should not be used as a sole basis for treatment. Nasal washings and  aspirates are unacceptable for Xpert Xpress SARS-CoV-2/FLU/RSV  testing.  Fact Sheet for Patients: PinkCheek.be  Fact Sheet for  Healthcare Providers: GravelBags.it  This test is not yet approved or cleared by the [place] FDA and  has been authorized for detection and/or diagnosis of SARS-CoV-2 by  FDA under an Emergency Use Authorization (EUA). This EUA will remain  in effect (meaning this test can be used) for the duration of the  Covid-19 declaration under Section 564(b)(1) of the Act, 21  U.S.C. section 360bbb-3(b)(1), unless the authorization is  terminated or revoked. Performed at [facility], [address] [place],  [address]          Radiology Studies: CT ABDOMEN PELVIS W CONTRAST  Result Date: 02/17/2020 CLINICAL DATA:  Abdominal pain EXAM: CT ABDOMEN AND PELVIS WITH CONTRAST TECHNIQUE: Multidetector CT imaging of the abdomen and pelvis was performed using the standard protocol following bolus administration of intravenous contrast. CONTRAST:  158mL OMNIPAQUE IOHEXOL 300 MG/ML  SOLN COMPARISON:  None. FINDINGS: Lower chest: No acute abnormality.  Bibasilar atelectasis. Hepatobiliary: Focal fatty deposition adjacent to the falciform ligament. Portal and hepatic veins are patent. Gallbladder is unremarkable. No intrahepatic or extrahepatic biliary ductal dilation. Pancreas: Unremarkable. No pancreatic ductal dilatation or surrounding inflammatory changes. Spleen: Normal in size without focal abnormality.  Splenules. Adrenals/Urinary Tract: Adrenal glands are unremarkable. Nonobstructive LEFT-sided nephrolithiasis. Kidneys enhance symmetrically. Bladder is unremarkable. Stomach/Bowel: There is segmental bowel wall thickening and mucosal enhancement within a loop of small bowel in the RIGHT lower quadrant which is closely adherent to the cecum and terminal ileum. There is robust adjacent fat stranding. There are several punctate foci of air which are not definitively intraluminal in location (series 5, image 41). There is apparent focal wall defect in this  region. The terminal ileum is diffusely thick walled. There is apparent tethering of the mesentery in the RIGHT lower quadrant with creeping fat extending from bowel wall thickening of loops of small bowel in this region. Status post appendectomy. No evidence of bowel obstruction. Vascular/Lymphatic: Aorta is normal in appearance. Prominent mesenteric lymph nodes with representative lymph node measuring 9 mm (series 2, image 45). Reproductive: Prostate is present. Other: Small volume free fluid in the pelvis. Musculoskeletal: Status post LEFT femoral medullary nail. IMPRESSION: 1. Constellation of findings are concerning for focal RIGHT lower quadrant small bowel perforation in the setting of inflammatory bowel disease. No focal organized drainable fluid collection. 2. Small volume free fluid in the pelvis. These results were called by telephone at the time of interpretation on 02/17/2020 at 4:30 pm to provider [facility] , who verbally acknowledged these results. Electronically Signed   By: Valentino Saxon MD  On: 02/17/2020 16:33   DG ABD ACUTE 2+V W 1V CHEST  Result Date: 02/18/2020 CLINICAL DATA:  Inflammatory bowel disease EXAM: X-RAY PA CHEST; SUPINE AND UPRIGHT ABDOMEN COMPARISON:  CT abdomen and pelvis February 17, 2020 FINDINGS: PA chest: Lungs are clear. Heart size and pulmonary vascularity are normal. No adenopathy. Supine and upright abdomen: No bowel dilatation. Occasional air-fluid levels in the right abdomen. No demonstrable free air. Probable small phleboliths in right pelvis. Screw in proximal left femur. IMPRESSION: Scattered air-fluid levels may represent enteritis or ileus. Bowel obstruction felt to be less likely. No free air demonstrable on this study. Lungs clear. Electronically Signed   By: Lowella Grip III M.D.   On: 02/18/2020 10:13        Scheduled Meds: . enoxaparin (LOVENOX) injection  40 mg Subcutaneous Q24H  . pantoprazole (PROTONIX) IV  40 mg Intravenous Q24H    Continuous Infusions: . dextrose 5 % and 0.45% NaCl 100 mL/hr at 02/18/20 0549  . piperacillin-tazobactam (ZOSYN)  IV 3.375 g (02/18/20 0546)     LOS: 1 day    Time spent:35 mins. More than 50% of that time was spent in counseling and/or coordination of care.      Shelly Coss, MD [facility] P10/10/2019, 1:25 PM

## 2020-02-18 NOTE — Telephone Encounter (Signed)
Requested Prescriptions  Pending Prescriptions Disp Refills   lisinopril (ZESTRIL) 5 MG tablet [Pharmacy Med Name: LISINOPRIL 5MG  TABLETS] 90 tablet 0    Sig: TAKE 1 TABLET(5 MG) BY MOUTH DAILY     Cardiovascular:  ACE Inhibitors Failed - 02/18/2020  3:37 AM      Failed - Valid encounter within last 6 months    Recent Outpatient Visits          6 months ago Benign hypertensive renal disease   [facility] [place], [place], DO   11 months ago Viral URI   [facility] Volney American, [place]   1 year ago Routine general medical examination at a health care facility   [facility], [place], DO   1 year ago Palpitations   [place], [place], DO   2 years ago Routine general medical examination at a health care facility   [place], [place], DO      Future Appointments            In 1 week Kathrine Haddock, NP [facility], [place]   In 1 month Johnson, Megan P, DO [place], [facility]           Passed - Cr in normal range and within 180 days    Creatinine, Ser  Date Value Ref Range Status  02/17/2020 0.97 0.61 - 1.24 mg/dL Final         Passed - K in normal range and within 180 days    Potassium  Date Value Ref Range Status  02/17/2020 3.8 3.5 - 5.1 mmol/L Final         Passed - Patient is not pregnant      Passed - Last BP in normal range    BP Readings from Last 1 Encounters:  02/18/20 117/76

## 2020-02-18 NOTE — Consult Note (Signed)
Lucilla Lame, MD [facility]  [address] [place], [place] [address] Phone: [phone] Fax : [phone]  Consultation  Referring Provider:     Dr. Francine Graven Primary Care Physician:  Valerie Roys, DO Primary Gastroenterologist: Althia Forts         Reason for Consultation:     Possible Crohn's disease  Date of Admission:  02/17/2020 Date of Consultation:  02/18/2020         HPI:   EARL LOSEE is a 39 y.o. male who reports that he is having abdominal pain with diarrhea for the last week.  The patient reports that he has had abdominal distention with abdominal pain.  He states that he did a colon cleanse approximately a week ago and then had dinner with his mother and shortly after that started to have abdominal discomfort.  The patient states his stools were black after taking Pepto-Bismol and he was also having diarrhea.  He was concerned because his abdomen started to be distended and his diarrhea was not resolving therefore he came to the emergency room.  At the emergency room he had a CT scan of the abdomen that showed him to have focal right lower quadrant small bowel perforation in the setting of inflammatory bowel disease with a small volume of free fluid in the pelvis.  The patient also had a KUB this morning that showed him to have scattered air-fluid levels representing enteritis or ileitis without any free air seen.  The patient was started on antibiotics in the ER. Upon getting consulted on this patient last night I sent off a C-reactive protein that was elevated at 12.7.  His white cell count on admission was 13.1 which is down to 10.5 today.  He also had a fecal calprotectin and GI panel ordered by me without any results as of yet.  The patient states that since being on antibiotics at admission he has been feeling better without any further diarrhea. The patient was seen by surgery for evaluation and reports that they are considering treating symptomatically and  conservatively.  Past Medical History:  Diagnosis Date  . Anxiety   . Hypertension   . Slipped epiphysis     Past Surgical History:  Procedure Laterality Date  . APPENDECTOMY  2004  . HIP SURGERY  1998   pin placed    Prior to Admission medications   Medication Sig Start Date End Date Taking? Authorizing Provider  amLODipine (NORVASC) 10 MG tablet Take 1 tablet (10 mg total) by mouth daily. 08/07/19   Johnson, Megan P, DO  fluticasone (FLONASE) 50 MCG/ACT nasal spray Place 2 sprays into both nostrils 2 (two) times daily as needed for allergies or rhinitis. 02/27/19   [facility] American, PA-C  lisinopril (ZESTRIL) 5 MG tablet TAKE 1 TABLET(5 MG) BY MOUTH DAILY 02/18/20   Valerie Roys, DO    Family History  Problem Relation Age of Onset  . Cancer Mother        breast  . Hypertension Maternal Grandfather   . Stroke Maternal Grandfather   . Heart disease Maternal Grandfather      Social History   Tobacco Use  . Smoking status: Never Smoker  . Smokeless tobacco: Never Used  Vaping Use  . Vaping Use: Never used  Substance Use Topics  . Alcohol use: No    Alcohol/week: 0.0 standard drinks  . Drug use: No    Allergies as of 02/17/2020 - Review Complete 02/17/2020  Allergen Reaction  Noted  . Codeine Nausea And Vomiting 10/13/2014    Review of Systems:    All systems reviewed and negative except where noted in HPI.   Physical Exam:  Vital signs in last 24 hours: Temp:  [98.4 F (36.9 C)-99 F (37.2 C)] 98.6 F (37 C) (10/06 0737) Pulse Rate:  [65-84] 72 (10/06 0737) Resp:  [16-18] 18 (10/06 0444) BP: (113-125)/(56-83) 117/77 (10/06 0737) SpO2:  [96 %-100 %] 98 % (10/06 0737) Weight:  [104.3 kg] 104.3 kg (10/05 1341) Last BM Date: 02/17/20 General:   Pleasant, cooperative in NAD Head:  Normocephalic and atraumatic. Eyes:   No icterus.   Conjunctiva pink. PERRLA. Ears:  Normal auditory acuity. Neck:  Supple; no masses or thyroidomegaly Lungs:  Respirations even and unlabored. Lungs clear to auscultation bilaterally.   No wheezes, crackles, or rhonchi.  Heart:  Regular rate and rhythm;  Without murmur, clicks, rubs or gallops Abdomen:  Soft, nondistended, nontender. Normal bowel sounds. No appreciable masses or hepatomegaly.  Positive rebound but no guarding.  Rectal:  Not performed. Msk:  Symmetrical without gross deformities.    Extremities:  Without edema, cyanosis or clubbing. Neurologic:  Alert and oriented x3;  grossly normal neurologically. Skin:  Intact without significant lesions or rashes. Cervical Nodes:  No significant cervical adenopathy. Psych:  Alert and cooperative. Normal affect.  LAB RESULTS: Recent Labs    02/17/20 1351 02/18/20 0410  WBC 13.1* 10.5  HGB 14.8 14.5  HCT 41.1 39.1  PLT 261 248   BMET Recent Labs    02/17/20 1351 02/18/20 0410  NA 133* 136  K 3.8 3.5  CL 98 100  CO2 25 25  GLUCOSE 91 98  BUN 10 9  CREATININE 0.97 1.13  CALCIUM 9.0 9.0   LFT Recent Labs    02/17/20 1351  PROT 7.9  ALBUMIN 4.3  AST 14*  ALT 18  ALKPHOS 58  BILITOT 1.2   PT/INR No results for input(s): LABPROT, INR in the last 72 hours.  STUDIES: CT ABDOMEN PELVIS W CONTRAST  Result Date: 02/17/2020 CLINICAL DATA:  Abdominal pain EXAM: CT ABDOMEN AND PELVIS WITH CONTRAST TECHNIQUE: Multidetector CT imaging of the abdomen and pelvis was performed using the standard protocol following bolus administration of intravenous contrast. CONTRAST:  111mL OMNIPAQUE IOHEXOL 300 MG/ML  SOLN COMPARISON:  None. FINDINGS: Lower chest: No acute abnormality.  Bibasilar atelectasis. Hepatobiliary: Focal fatty deposition adjacent to the falciform ligament. Portal and hepatic veins are patent. Gallbladder is unremarkable. No intrahepatic or extrahepatic biliary ductal dilation. Pancreas: Unremarkable. No pancreatic ductal dilatation or surrounding inflammatory changes. Spleen: Normal in size without focal abnormality.  Splenules.  Adrenals/Urinary Tract: Adrenal glands are unremarkable. Nonobstructive LEFT-sided nephrolithiasis. Kidneys enhance symmetrically. Bladder is unremarkable. Stomach/Bowel: There is segmental bowel wall thickening and mucosal enhancement within a loop of small bowel in the RIGHT lower quadrant which is closely adherent to the cecum and terminal ileum. There is robust adjacent fat stranding. There are several punctate foci of air which are not definitively intraluminal in location (series 5, image 41). There is apparent focal wall defect in this region. The terminal ileum is diffusely thick walled. There is apparent tethering of the mesentery in the RIGHT lower quadrant with creeping fat extending from bowel wall thickening of loops of small bowel in this region. Status post appendectomy. No evidence of bowel obstruction. Vascular/Lymphatic: Aorta is normal in appearance. Prominent mesenteric lymph nodes with representative lymph node measuring 9 mm (series 2, image 45). Reproductive: Prostate is  present. Other: Small volume free fluid in the pelvis. Musculoskeletal: Status post LEFT femoral medullary nail. IMPRESSION: 1. Constellation of findings are concerning for focal RIGHT lower quadrant small bowel perforation in the setting of inflammatory bowel disease. No focal organized drainable fluid collection. 2. Small volume free fluid in the pelvis. These results were called by telephone at the time of interpretation on 02/17/2020 at 4:30 pm to provider [facility] , who verbally acknowledged these results. Electronically Signed   By: Valentino Saxon MD   On: 02/17/2020 16:33   DG ABD ACUTE 2+V W 1V CHEST  Result Date: 02/18/2020 CLINICAL DATA:  Inflammatory bowel disease EXAM: X-RAY PA CHEST; SUPINE AND UPRIGHT ABDOMEN COMPARISON:  CT abdomen and pelvis February 17, 2020 FINDINGS: PA chest: Lungs are clear. Heart size and pulmonary vascularity are normal. No adenopathy. Supine and upright abdomen: No bowel  dilatation. Occasional air-fluid levels in the right abdomen. No demonstrable free air. Probable small phleboliths in right pelvis. Screw in proximal left femur. IMPRESSION: Scattered air-fluid levels may represent enteritis or ileus. Bowel obstruction felt to be less likely. No free air demonstrable on this study. Lungs clear. Electronically Signed   By: Lowella Grip III M.D.   On: 02/18/2020 10:13      Impression / Plan:   Assessment: Principal Problem:   Inflammatory bowel disease Active Problems:   Hypertension   IBRAHAM LEVI is a 39 y.o. y/o male with imaging showing possible Crohn's disease with focal perforation.  The patient is feeling much better today.  He denies any family history of inflammatory bowel disease or problems in the past.  He does report that he has been having knee pain for the last few months which may be related to extraintestinal manifestations of inflammatory bowel disease.  He has also had dry eyes but he states this has been for 2 years.  Plan:  The patient is not a candidate for any endoscopic procedures at this time.  Any insufflation of air and distention of the colon can make the perforation worse.  The patient should be continued on antibiotics for a total of 14 days.  He should also be started on a clear liquid diet since he is not reporting any worsening of symptoms at the present time and he will not undergo any procedures at this point.  The patient should have a repeat CT scan in 5 days if he progresses well without any worsening of his perforation or abscess formation.  Until a definitive diagnosis is made with biopsies I do not believe steroids or immunosuppressive medication should be used.  Crohn's disease is not typically amenable to 5 amino salicylic acids and budesonide may decrease healing of the perforated area.  I have communicated my plan with the patient and his wife and will continue following with you.  Thank you for involving me  in the care of this patient.      LOS: 1 day   Lucilla Lame, MD, [facility] 02/18/2020, 12:08 PM,  Pager [phone] 7am-5pm  Check AMION for 5pm -7am coverage and on weekends   Note: This dictation was prepared with Dragon dictation along with smaller phrase technology. Any transcriptional errors that result from this process are unintentional.

## 2020-02-18 NOTE — Consult Note (Signed)
[facility] SURGICAL CONSULTATION NOTE (initial) - cpt: 25852   HISTORY OF PRESENT ILLNESS (HPI):  39 y.o. male presented to [facility] ED yesterday (10/05) for evaluation of abdominal pain. Patient reports that he underwent a "dietary cleanse" around 1 week ago. He then developed a constant, achy, epigastric abdominal pain for the last 4-5 days prior to presentation. However, the pain has since become periumbilical, moderate in intensity, and constant. Tried OTC heartburn medications, Pepto, without relief. He has had decrease in PO intake over the last 48 hours as well however what he is able to tolerate does not cause exacerbation of pain. No associated fever, chills, cough, CP, SOB, nausea, emesis, urinary changes, nor bowel changes. No personal history of IBD. He believes his mother had no formal diagnosis of IBD but always had "bowel issues." He is colonoscopy naive. Only previous abdominal surgery is appendectomy. Work up in the ED was concerning for mild leukocytosis to 13.1 but otherwise laboratory results were reassuring. CT Abdomen/Pelvis was concerning for terminal ileum inflammation with two small bubble of extraluminal air. He was admitted to he medicine service, started on Zosyn, and GI consult also obtained.   Surgery is consulted by emergency medicine physician Dr. Vladimir Crofts, MD in this context for evaluation and management of small bowel inflammation with two small bubbles of extraluminal air in the setting of likely undiagnosed IBD (ex: Crohn's).  PAST MEDICAL HISTORY (PMH):  Past Medical History:  Diagnosis Date  . Anxiety   . Hypertension   . Slipped epiphysis      PAST SURGICAL HISTORY ([place]):  Past Surgical History:  Procedure Laterality Date  . APPENDECTOMY  2004  . HIP SURGERY  1998   pin placed     MEDICATIONS:  Prior to Admission medications   Medication Sig Start Date End Date Taking? Authorizing Provider  amLODipine (NORVASC) 10 MG tablet Take 1  tablet (10 mg total) by mouth daily. 08/07/19   Johnson, Megan P, DO  fluticasone (FLONASE) 50 MCG/ACT nasal spray Place 2 sprays into both nostrils 2 (two) times daily as needed for allergies or rhinitis. 02/27/19   Volney American, PA-C  lisinopril (ZESTRIL) 5 MG tablet TAKE 1 TABLET(5 MG) BY MOUTH DAILY 02/18/20   Park Liter P, DO     ALLERGIES:  Allergies  Allergen Reactions  . Codeine Nausea And Vomiting     SOCIAL HISTORY:  Social History   Socioeconomic History  . Marital status: Married    Spouse name: Not on file  . Number of children: Not on file  . Years of education: Not on file  . Highest education level: Not on file  Occupational History  . Not on file  Tobacco Use  . Smoking status: Never Smoker  . Smokeless tobacco: Never Used  Vaping Use  . Vaping Use: Never used  Substance and Sexual Activity  . Alcohol use: No    Alcohol/week: 0.0 standard drinks  . Drug use: No  . Sexual activity: Yes  Other Topics Concern  . Not on file  Social History Narrative  . Not on file   Social Determinants of Health   Financial Resource Strain:   . Difficulty of Paying Living Expenses: Not on file  Food Insecurity:   . Worried About Charity fundraiser in the Last Year: Not on file  . Ran Out of Food in the Last Year: Not on file  Transportation Needs:   . Lack of Transportation (Medical): Not on file  .  Lack of Transportation (Non-Medical): Not on file  Physical Activity:   . Days of Exercise per Week: Not on file  . Minutes of Exercise per Session: Not on file  Stress:   . Feeling of Stress : Not on file  Social Connections:   . Frequency of Communication with Friends and Family: Not on file  . Frequency of Social Gatherings with Friends and Family: Not on file  . Attends Religious Services: Not on file  . Active Member of Clubs or Organizations: Not on file  . Attends Archivist Meetings: Not on file  . Marital Status: Not on file  Intimate  Partner Violence:   . Fear of Current or Ex-Partner: Not on file  . Emotionally Abused: Not on file  . Physically Abused: Not on file  . Sexually Abused: Not on file     FAMILY HISTORY:  Family History  Problem Relation Age of Onset  . Cancer Mother        breast  . Hypertension Maternal Grandfather   . Stroke Maternal Grandfather   . Heart disease Maternal Grandfather       REVIEW OF SYSTEMS:  Review of Systems  Constitutional: Negative for chills and fever.  Respiratory: Negative for cough and shortness of breath.   Cardiovascular: Negative for chest pain and palpitations.  Gastrointestinal: Positive for abdominal pain, blood in stool (Resolved) and diarrhea (Resolved). Negative for constipation, nausea and vomiting.  Genitourinary: Negative for dysuria and urgency.  Neurological: Negative for dizziness and headaches.  All other systems reviewed and are negative.   VITAL SIGNS:  Temp:  [98.4 F (36.9 C)-99 F (37.2 C)] 98.4 F (36.9 C) (10/06 0444) Pulse Rate:  [65-84] 68 (10/06 0444) Resp:  [16-18] 18 (10/06 0444) BP: (113-125)/(56-83) 117/76 (10/06 0444) SpO2:  [96 %-100 %] 96 % (10/06 0444) Weight:  [104.3 kg] 104.3 kg (10/05 1341)     Height: 6\' 1"  (185.4 cm) Weight: 104.3 kg BMI (Calculated): 30.35   INTAKE/OUTPUT:  10/05 0701 - 10/06 0700 In: 0  Out: 200 [Urine:200]  PHYSICAL EXAM:  Physical Exam Vitals and nursing note reviewed. Exam conducted with a chaperone present.  Constitutional:      General: He is not in acute distress.    Appearance: He is well-developed and normal weight. He is not ill-appearing.  HENT:     Head: Normocephalic and atraumatic.  Eyes:     General: No scleral icterus.    Extraocular Movements: Extraocular movements intact.  Cardiovascular:     Rate and Rhythm: Normal rate and regular rhythm.     Heart sounds: Normal heart sounds. No murmur heard.  No friction rub. No gallop.   Pulmonary:     Effort: Pulmonary effort is  normal. No respiratory distress.     Breath sounds: Normal breath sounds.  Abdominal:     General: A surgical scar is present. There is no distension.     Palpations: Abdomen is soft.     Tenderness: There is no abdominal tenderness. There is no guarding or rebound.     Comments: Soft, non-tender, non-distended, no rebound/guarding, no peritonitis, previous laparoscopic incisions seen   Genitourinary:    Comments: Deferred Skin:    General: Skin is warm and dry.     Coloration: Skin is not jaundiced or pale.  Neurological:     General: No focal deficit present.     Mental Status: He is alert and oriented to person, place, and time.  Psychiatric:  Mood and Affect: Mood normal.        Behavior: Behavior normal.      Labs:  CBC Latest Ref Rng & Units 02/18/2020 02/17/2020 05/23/2018  WBC 4.0 - 10.5 K/uL 10.5 13.1(H) 8.7  Hemoglobin 13.0 - 17.0 g/dL 14.5 14.8 15.7  Hematocrit 39 - 52 % 39.1 41.1 44.9  Platelets 150 - 400 K/uL 248 261 306   CMP Latest Ref Rng & Units 02/18/2020 02/17/2020 08/07/2019  Glucose 70 - 99 mg/dL 98 91 77  BUN 6 - 20 mg/dL 9 10 11   Creatinine 0.61 - 1.24 mg/dL 1.13 0.97 0.99  Sodium 135 - 145 mmol/L 136 133(L) 139  Potassium 3.5 - 5.1 mmol/L 3.5 3.8 4.4  Chloride 98 - 111 mmol/L 100 98 101  CO2 22 - 32 mmol/L 25 25 24   Calcium 8.9 - 10.3 mg/dL 9.0 9.0 9.3  Total Protein 6.5 - 8.1 g/dL - 7.9 7.4  Total Bilirubin 0.3 - 1.2 mg/dL - 1.2 0.8  Alkaline Phos 38 - 126 U/L - 58 65  AST 15 - 41 U/L - 14(L) 21  ALT 0 - 44 U/L - 18 24     Imaging studies:   CT Abdomen/Pelvis (02/17/2020) personally reviewed showing terminal ileum inflammation and two small bubbles of extraluminal air, and radiologist report reviewed below:  IMPRESSION: 1. Constellation of findings are concerning for focal RIGHT lower quadrant small bowel perforation in the setting of inflammatory bowel disease. No focal organized drainable fluid collection. 2. Small volume free fluid in  the pelvis.   Assessment/Plan: (ICD-10's: K52.9) 39 y.o. male with abdominal pain and terminal ileum inflammation and contained microperforation likely undiagnosed IBD, Crohn's.   - Appreciate medicine admission  - NPO for now; if pain continues to improve and no planned GI procedures then okay with CLD from surgical perspective  - Agree with GI consultation for IBD management; likely unable to perform colonoscopy inpatient given microperforation  - Continue IV ABx (Zosyn)  - Monitor abdominal examination; on-going bowel function  - Pain control prn; antiemetics prn  - No need for emergent surgical intervention  - Follow up CBC  - Mobilization as toelrates   - Further management per primary service; we will follow  All of the above findings and recommendations were discussed with the patient, and all of patient's questions were answered to his expressed satisfaction.  Thank you for the opportunity to participate in this patient's care.   -- Edison Simon, PA-C [facility] 02/18/2020, 7:02 AM [phone] M-F: 7am - 4pm

## 2020-02-19 ENCOUNTER — Other Ambulatory Visit: Payer: Self-pay | Admitting: Family Medicine

## 2020-02-19 DIAGNOSIS — K631 Perforation of intestine (nontraumatic): Secondary | ICD-10-CM

## 2020-02-19 DIAGNOSIS — K529 Noninfective gastroenteritis and colitis, unspecified: Secondary | ICD-10-CM | POA: Diagnosis not present

## 2020-02-19 LAB — GASTROINTESTINAL PANEL BY PCR, STOOL (REPLACES STOOL CULTURE)

## 2020-02-19 LAB — BASIC METABOLIC PANEL
Anion gap: 8 (ref 5–15)
BUN: 5 mg/dL — ABNORMAL LOW (ref 6–20)
CO2: 27 mmol/L (ref 22–32)
Calcium: 9 mg/dL (ref 8.9–10.3)
Chloride: 100 mmol/L (ref 98–111)
Creatinine, Ser: 1.07 mg/dL (ref 0.61–1.24)
GFR calc non Af Amer: >60 mL/min (ref >60)
Glucose, Bld: 150 mg/dL — ABNORMAL HIGH (ref 70–99)
Potassium: 4.2 mmol/L (ref 3.5–5.1)
Sodium: 135 mmol/L (ref 135–145)

## 2020-02-19 LAB — CBC
HCT: 41.3 % (ref 39.0–52.0)
Hemoglobin: 14.4 g/dL (ref 13.0–17.0)
MCH: 30.4 pg (ref 26.0–34.0)
MCHC: 34.9 g/dL (ref 30.0–36.0)
MCV: 87.1 fL (ref 80.0–100.0)
Platelets: 269 10*3/uL (ref 150–400)
RBC: 4.74 MIL/uL (ref 4.22–5.81)
RDW: 12.1 % (ref 11.5–15.5)
WBC: 9.5 10*3/uL (ref 4.0–10.5)
nRBC: 0 % (ref 0.0–0.2)

## 2020-02-19 NOTE — Progress Notes (Addendum)
PROGRESS NOTE    JAKEEL STARLIPER  OQH:[id number] DOB: [birth year] DOA: 02/17/2020 PCP: Valerie Roys, DO   Chief Complain: Abdominal pain  Brief Narrative: Patient is a 39 year old male with history of hypertension, anxiety disorder who presents to the emergency department with complaints of abdominal pain mainly in the periumbilical and right lower quadrant associated with anorexia, bloating, loose watery stools for about 5 days.  He would have chills but no fever.  CT abdomen/pelvis done on presentation showed possible small bowel perforation on the right lower quadrant in the setting of inflammatory disease.  No history of Crohn's or ulcerative colitis in the past.  GI, general surgery consulted.  Started on antibiotics.  No plan for emergent surgery or colonoscopy.  Plan to repeat CT scan tomorrow.  Assessment & Plan:   Principal Problem:   Inflammatory bowel disease Active Problems:   Hypertension  Suspected inflammatory bowel disease/small bowel perforation: Presented with abdomen pain, bloating, chills.  No history of inflammatory bowel disease in the past.  Imagings were suggestive of possible perforation in RLQ/the small bowel.  Started on antibiotic, IV fluids, pain management. GI, general surgery consulted.No plan for emergent surgery or colonoscopy.  Plan to repeat CT scan tomorrow We will plan to continue antibiotics for total of 2 weeks.  He does not complain of any abdomen pain this morning.  Had a good bowel movement today. Diet advanced GI pathogen panel showed enteropathogenic E. Coli.Interesting finding.  He was having diarrhea before presentation.  Continue current antibiotics.  Hypertension: Currently blood pressure stable.  Continue monitor blood pressure.  Takes amlodipine and lisinopril at home.         DVT prophylaxis:Lovenox Code Status: Full Family Communication: Wife present at the bedside Status is: Inpatient  Remains inpatient appropriate  because:Inpatient level of care appropriate due to severity of illness   Dispo: The patient is from: Home              Anticipated d/c is to: Home              Anticipated d/c date is: tomorrow              Patient currently is not medically stable to d/c.      Consultants: GI, general surgery  Procedures: None  Antimicrobials:  Anti-infectives (From admission, onward)   Start     Dose/Rate Route Frequency Ordered Stop   02/17/20 2200  piperacillin-tazobactam (ZOSYN) IVPB 3.375 g        3.375 g 12.5 mL/hr over 240 Minutes Intravenous Every 8 hours 02/17/20 1738     02/17/20 1700  piperacillin-tazobactam (ZOSYN) IVPB 3.375 g        3.375 g 100 mL/hr over 30 Minutes Intravenous  Once 02/17/20 1649 02/17/20 1808      Subjective: Patient seen and examined the bedside this morning.  Hemodynamically stable.  Very comfortable but denies any abdominal pain today.  No nausea or vomiting.  Had a good bowel movement this morning.  Eager to go home  Objective: Vitals:   02/18/20 1219 02/18/20 1948 02/19/20 0036 02/19/20 0601  BP: 115/75 122/78 (!) 96/59 116/68  Pulse: 66 74 65 63  Resp: 15 18 16 16   Temp: 99 F (37.2 C) 99.4 F (37.4 C) 98.2 F (36.8 C) 98.3 F (36.8 C)  TempSrc: Oral  Oral Oral  SpO2: 97% 97% 96% 97%  Weight:      Height:        Intake/Output Summary (Last  24 hours) at 02/19/2020 0757 Last data filed at 02/19/2020 0520 Gross per 24 hour  Intake 2088.6 ml  Output 975 ml  Net 1113.6 ml   Filed Weights   02/17/20 1341  Weight: 104.3 kg    Examination:  General exam: Appears calm and comfortable ,Not in distress,obese HEENT:PERRL,Oral mucosa moist, Ear/Nose normal on gross exam Respiratory system: Bilateral equal air entry, normal vesicular breath sounds, no wheezes or crackles  Cardiovascular system: S1 & S2 heard, RRR. No JVD, murmurs, rubs, gallops or clicks. Gastrointestinal system: Abdomen is nondistended, soft and nontender. No organomegaly or  masses felt. Normal bowel sounds heard. Central nervous system: Alert and oriented. No focal neurological deficits. Extremities: No edema, no clubbing ,no cyanosis Skin: No rashes, lesions or ulcers,no icterus ,no pallor    Data Reviewed: I have personally reviewed following labs and imaging studies  CBC: Recent Labs  Lab 02/17/20 1351 02/18/20 0410  WBC 13.1* 10.5  HGB 14.8 14.5  HCT 41.1 39.1  MCV 85.3 84.6  PLT 261 742   Basic Metabolic Panel: Recent Labs  Lab 02/17/20 1351 02/18/20 0410  NA 133* 136  K 3.8 3.5  CL 98 100  CO2 25 25  GLUCOSE 91 98  BUN 10 9  CREATININE 0.97 1.13  CALCIUM 9.0 9.0   GFR: Estimated Creatinine Clearance: 112.5 mL/min (by C-G formula based on SCr of 1.13 mg/dL). Liver Function Tests: Recent Labs  Lab 02/17/20 1351  AST 14*  ALT 18  ALKPHOS 58  BILITOT 1.2  PROT 7.9  ALBUMIN 4.3   Recent Labs  Lab 02/17/20 1351  LIPASE 25   No results for input(s): AMMONIA in the last 168 hours. Coagulation Profile: No results for input(s): INR, PROTIME in the last 168 hours. Cardiac Enzymes: No results for input(s): CKTOTAL, CKMB, CKMBINDEX, TROPONINI in the last 168 hours. BNP (last 3 results) No results for input(s): PROBNP in the last 8760 hours. HbA1C: No results for input(s): HGBA1C in the last 72 hours. CBG: No results for input(s): GLUCAP in the last 168 hours. Lipid Profile: No results for input(s): CHOL, HDL, LDLCALC, TRIG, CHOLHDL, LDLDIRECT in the last 72 hours. Thyroid Function Tests: No results for input(s): TSH, T4TOTAL, FREET4, T3FREE, THYROIDAB in the last 72 hours. Anemia Panel: No results for input(s): VITAMINB12, FOLATE, FERRITIN, TIBC, IRON, RETICCTPCT in the last 72 hours. Sepsis Labs: No results for input(s): PROCALCITON, LATICACIDVEN in the last 168 hours.  Recent Results (from the past 240 hour(s))  Respiratory Panel by RT PCR (Flu A&B, Covid) - Nasopharyngeal Swab     Status: None   Collection Time:  02/17/20  5:14 PM   Specimen: Nasopharyngeal Swab  Result Value Ref Range Status   SARS Coronavirus 2 by RT PCR NEGATIVE NEGATIVE Final    Comment: (NOTE) SARS-CoV-2 target nucleic acids are NOT DETECTED.  The SARS-CoV-2 RNA is generally detectable in upper respiratoy specimens during the acute phase of infection. The lowest concentration of SARS-CoV-2 viral copies this assay can detect is 131 copies/mL. A negative result does not preclude SARS-Cov-2 infection and should not be used as the sole basis for treatment or other patient management decisions. A negative result may occur with  improper specimen collection/handling, submission of specimen other than nasopharyngeal swab, presence of viral mutation(s) within the areas targeted by this assay, and inadequate number of viral copies (<131 copies/mL). A negative result must be combined with clinical observations, patient history, and epidemiological information. The expected result is Negative.  Fact Sheet  for Patients:  PinkCheek.be  Fact Sheet for Healthcare Providers:  GravelBags.it  This test is no t yet approved or cleared by the [place] FDA and  has been authorized for detection and/or diagnosis of SARS-CoV-2 by FDA under an Emergency Use Authorization (EUA). This EUA will remain  in effect (meaning this test can be used) for the duration of the COVID-19 declaration under Section 564(b)(1) of the Act, 21 U.S.C. section 360bbb-3(b)(1), unless the authorization is terminated or revoked sooner.     Influenza A by PCR NEGATIVE NEGATIVE Final   Influenza B by PCR NEGATIVE NEGATIVE Final    Comment: (NOTE) The Xpert Xpress SARS-CoV-2/FLU/RSV assay is intended as an aid in  the diagnosis of influenza from Nasopharyngeal swab specimens and  should not be used as a sole basis for treatment. Nasal washings and  aspirates are unacceptable for Xpert Xpress  SARS-CoV-2/FLU/RSV  testing.  Fact Sheet for Patients: PinkCheek.be  Fact Sheet for Healthcare Providers: GravelBags.it  This test is not yet approved or cleared by the [place] FDA and  has been authorized for detection and/or diagnosis of SARS-CoV-2 by  FDA under an Emergency Use Authorization (EUA). This EUA will remain  in effect (meaning this test can be used) for the duration of the  Covid-19 declaration under Section 564(b)(1) of the Act, 21  U.S.C. section 360bbb-3(b)(1), unless the authorization is  terminated or revoked. Performed at [facility], [address] [place], [place] [address]          Radiology Studies: CT ABDOMEN PELVIS W CONTRAST  Result Date: 02/17/2020 CLINICAL DATA:  Abdominal pain EXAM: CT ABDOMEN AND PELVIS WITH CONTRAST TECHNIQUE: Multidetector CT imaging of the abdomen and pelvis was performed using the standard protocol following bolus administration of intravenous contrast. CONTRAST:  153mL OMNIPAQUE IOHEXOL 300 MG/ML  SOLN COMPARISON:  None. FINDINGS: Lower chest: No acute abnormality.  Bibasilar atelectasis. Hepatobiliary: Focal fatty deposition adjacent to the falciform ligament. Portal and hepatic veins are patent. Gallbladder is unremarkable. No intrahepatic or extrahepatic biliary ductal dilation. Pancreas: Unremarkable. No pancreatic ductal dilatation or surrounding inflammatory changes. Spleen: Normal in size without focal abnormality.  Splenules. Adrenals/Urinary Tract: Adrenal glands are unremarkable. Nonobstructive LEFT-sided nephrolithiasis. Kidneys enhance symmetrically. Bladder is unremarkable. Stomach/Bowel: There is segmental bowel wall thickening and mucosal enhancement within a loop of small bowel in the RIGHT lower quadrant which is closely adherent to the cecum and terminal ileum. There is robust adjacent fat stranding. There are several punctate foci of air which  are not definitively intraluminal in location (series 5, image 41). There is apparent focal wall defect in this region. The terminal ileum is diffusely thick walled. There is apparent tethering of the mesentery in the RIGHT lower quadrant with creeping fat extending from bowel wall thickening of loops of small bowel in this region. Status post appendectomy. No evidence of bowel obstruction. Vascular/Lymphatic: Aorta is normal in appearance. Prominent mesenteric lymph nodes with representative lymph node measuring 9 mm (series 2, image 45). Reproductive: Prostate is present. Other: Small volume free fluid in the pelvis. Musculoskeletal: Status post LEFT femoral medullary nail. IMPRESSION: 1. Constellation of findings are concerning for focal RIGHT lower quadrant small bowel perforation in the setting of inflammatory bowel disease. No focal organized drainable fluid collection. 2. Small volume free fluid in the pelvis. These results were called by telephone at the time of interpretation on 02/17/2020 at 4:30 pm to provider [facility] , who verbally acknowledged these results. Electronically Signed  By: Valentino Saxon MD   On: 02/17/2020 16:33   DG ABD ACUTE 2+V W 1V CHEST  Result Date: 02/18/2020 CLINICAL DATA:  Inflammatory bowel disease EXAM: X-RAY PA CHEST; SUPINE AND UPRIGHT ABDOMEN COMPARISON:  CT abdomen and pelvis February 17, 2020 FINDINGS: PA chest: Lungs are clear. Heart size and pulmonary vascularity are normal. No adenopathy. Supine and upright abdomen: No bowel dilatation. Occasional air-fluid levels in the right abdomen. No demonstrable free air. Probable small phleboliths in right pelvis. Screw in proximal left femur. IMPRESSION: Scattered air-fluid levels may represent enteritis or ileus. Bowel obstruction felt to be less likely. No free air demonstrable on this study. Lungs clear. Electronically Signed   By: Lowella Grip III M.D.   On: 02/18/2020 10:13        Scheduled Meds: .  enoxaparin (LOVENOX) injection  40 mg Subcutaneous Q24H  . pantoprazole (PROTONIX) IV  40 mg Intravenous Q24H   Continuous Infusions: . dextrose 5 % and 0.45% NaCl 75 mL/hr at 02/19/20 0739  . piperacillin-tazobactam (ZOSYN)  IV 3.375 g (02/19/20 0518)     LOS: 2 days    Time spent:35 mins. More than 50% of that time was spent in counseling and/or coordination of care.      Shelly Coss, MD [facility] P10/11/2019, 7:57 AM

## 2020-02-19 NOTE — Plan of Care (Signed)
Patient complained of pain once during night shift and morphine was given. No bowel movements yet. Tolerating clear liquid diet.  Will continue to monitor.  [name]

## 2020-02-19 NOTE — Progress Notes (Signed)
Gordon Lame, MD [facility]   [address] [place], [place] [address] Phone: [phone] Fax : [phone]   Subjective: The patient reports that he is doing much better today.  He had a solid bowel movement followed by some diarrhea.  The patient denies any fevers chills nausea vomiting fevers or chills.  His abdominal pain is much improved.  The patient is set up for a repeat CT scan for tomorrow.  There has been no nausea or vomiting.  The patient had enteropathogenic E. coli detected on his recent GI panel.  His white cell count was 9.5 today.   Objective: Vital signs in last 24 hours: Vitals:   02/18/20 1948 02/19/20 0036 02/19/20 0601 02/19/20 1218  BP: 122/78 (!) 96/59 116/68 121/80  Pulse: 74 65 63 63  Resp: 18 16 16 20   Temp: 99.4 F (37.4 C) 98.2 F (36.8 C) 98.3 F (36.8 C) 98.6 F (37 C)  TempSrc:  Oral Oral   SpO2: 97% 96% 97% 97%  Weight:      Height:       Weight change:   Intake/Output Summary (Last 24 hours) at 02/19/2020 1517 Last data filed at 02/19/2020 1418 Gross per 24 hour  Intake 2748.6 ml  Output 675 ml  Net 2073.6 ml     Exam: Heart:: Regular rate and rhythm, S1S2 present or without murmur or extra heart sounds Lungs: normal and clear to auscultation and percussion Abdomen: soft, nontender, normal bowel sounds   Lab Results: @LABTEST2 @ Micro Results: Recent Results (from the past 240 hour(s))  Respiratory Panel by RT PCR (Flu A&B, Covid) - Nasopharyngeal Swab     Status: None   Collection Time: 02/17/20  5:14 PM   Specimen: Nasopharyngeal Swab  Result Value Ref Range Status   SARS Coronavirus 2 by RT PCR NEGATIVE NEGATIVE Final    Comment: (NOTE) SARS-CoV-2 target nucleic acids are NOT DETECTED.  The SARS-CoV-2 RNA is generally detectable in upper respiratoy specimens during the acute phase of infection. The lowest concentration of SARS-CoV-2 viral copies this assay can detect is 131 copies/mL. A negative result does not  preclude SARS-Cov-2 infection and should not be used as the sole basis for treatment or other patient management decisions. A negative result may occur with  improper specimen collection/handling, submission of specimen other than nasopharyngeal swab, presence of viral mutation(s) within the areas targeted by this assay, and inadequate number of viral copies (<131 copies/mL). A negative result must be combined with clinical observations, patient history, and epidemiological information. The expected result is Negative.  Fact Sheet for Patients:  PinkCheek.be  Fact Sheet for Healthcare Providers:  GravelBags.it  This test is no t yet approved or cleared by the [place] FDA and  has been authorized for detection and/or diagnosis of SARS-CoV-2 by FDA under an Emergency Use Authorization (EUA). This EUA will remain  in effect (meaning this test can be used) for the duration of the COVID-19 declaration under Section 564(b)(1) of the Act, 21 U.S.C. section 360bbb-3(b)(1), unless the authorization is terminated or revoked sooner.     Influenza A by PCR NEGATIVE NEGATIVE Final   Influenza B by PCR NEGATIVE NEGATIVE Final    Comment: (NOTE) The Xpert Xpress SARS-CoV-2/FLU/RSV assay is intended as an aid in  the diagnosis of influenza from Nasopharyngeal swab specimens and  should not be used as a sole basis for treatment. Nasal washings and  aspirates are unacceptable for Xpert Xpress SARS-CoV-2/FLU/RSV  testing.  Fact Sheet for  Patients: PinkCheek.be  Fact Sheet for Healthcare Providers: GravelBags.it  This test is not yet approved or cleared by the [place] FDA and  has been authorized for detection and/or diagnosis of SARS-CoV-2 by  FDA under an Emergency Use Authorization (EUA). This EUA will remain  in effect (meaning this test can be used) for the  duration of the  Covid-19 declaration under Section 564(b)(1) of the Act, 21  U.S.C. section 360bbb-3(b)(1), unless the authorization is  terminated or revoked. Performed at [facility], [address] [place], [place] [address]   Gastrointestinal Panel by PCR , Stool     Status: Abnormal   Collection Time: 02/19/20  9:20 AM   Specimen: Stool  Result Value Ref Range Status   Campylobacter species NOT DETECTED NOT DETECTED Final   Plesimonas shigelloides NOT DETECTED NOT DETECTED Final   Salmonella species NOT DETECTED NOT DETECTED Final   Yersinia enterocolitica NOT DETECTED NOT DETECTED Final   Vibrio species NOT DETECTED NOT DETECTED Final   Vibrio cholerae NOT DETECTED NOT DETECTED Final   Enteroaggregative E coli (EAEC) NOT DETECTED NOT DETECTED Final   Enteropathogenic E coli (EPEC) DETECTED (A) NOT DETECTED Final    Comment: RESULT CALLED TO, READ BACK BY AND VERIFIED WITH: TINA LEACH AT 1247 ON 02/19/2020 [place].    Enterotoxigenic E coli (ETEC) NOT DETECTED NOT DETECTED Final   Shiga like toxin producing E coli (STEC) NOT DETECTED NOT DETECTED Final   Shigella/Enteroinvasive E coli (EIEC) NOT DETECTED NOT DETECTED Final   Cryptosporidium NOT DETECTED NOT DETECTED Final   Cyclospora cayetanensis NOT DETECTED NOT DETECTED Final   Entamoeba histolytica NOT DETECTED NOT DETECTED Final   Giardia lamblia NOT DETECTED NOT DETECTED Final   Adenovirus F40/41 NOT DETECTED NOT DETECTED Final   Astrovirus NOT DETECTED NOT DETECTED Final   Norovirus GI/GII NOT DETECTED NOT DETECTED Final   Rotavirus A NOT DETECTED NOT DETECTED Final   Sapovirus (I, II, IV, and V) NOT DETECTED NOT DETECTED Final    Comment: Performed at [facility], [address] [place], [place] [address]   Studies/Results: CT ABDOMEN PELVIS W CONTRAST  Result Date: 02/17/2020 CLINICAL DATA:  Abdominal pain EXAM: CT ABDOMEN AND PELVIS WITH CONTRAST TECHNIQUE: Multidetector CT imaging of the  abdomen and pelvis was performed using the standard protocol following bolus administration of intravenous contrast. CONTRAST:  150mL OMNIPAQUE IOHEXOL 300 MG/ML  SOLN COMPARISON:  None. FINDINGS: Lower chest: No acute abnormality.  Bibasilar atelectasis. Hepatobiliary: Focal fatty deposition adjacent to the falciform ligament. Portal and hepatic veins are patent. Gallbladder is unremarkable. No intrahepatic or extrahepatic biliary ductal dilation. Pancreas: Unremarkable. No pancreatic ductal dilatation or surrounding inflammatory changes. Spleen: Normal in size without focal abnormality.  Splenules. Adrenals/Urinary Tract: Adrenal glands are unremarkable. Nonobstructive LEFT-sided nephrolithiasis. Kidneys enhance symmetrically. Bladder is unremarkable. Stomach/Bowel: There is segmental bowel wall thickening and mucosal enhancement within a loop of small bowel in the RIGHT lower quadrant which is closely adherent to the cecum and terminal ileum. There is robust adjacent fat stranding. There are several punctate foci of air which are not definitively intraluminal in location (series 5, image 41). There is apparent focal wall defect in this region. The terminal ileum is diffusely thick walled. There is apparent tethering of the mesentery in the RIGHT lower quadrant with creeping fat extending from bowel wall thickening of loops of small bowel in this region. Status post appendectomy. No evidence of bowel obstruction. Vascular/Lymphatic: Aorta is normal in appearance. Prominent  mesenteric lymph nodes with representative lymph node measuring 9 mm (series 2, image 45). Reproductive: Prostate is present. Other: Small volume free fluid in the pelvis. Musculoskeletal: Status post LEFT femoral medullary nail. IMPRESSION: 1. Constellation of findings are concerning for focal RIGHT lower quadrant small bowel perforation in the setting of inflammatory bowel disease. No focal organized drainable fluid collection. 2. Small volume  free fluid in the pelvis. These results were called by telephone at the time of interpretation on 02/17/2020 at 4:30 pm to provider [facility] , who verbally acknowledged these results. Electronically Signed   By: Valentino Saxon MD   On: 02/17/2020 16:33   DG ABD ACUTE 2+V W 1V CHEST  Result Date: 02/18/2020 CLINICAL DATA:  Inflammatory bowel disease EXAM: X-RAY PA CHEST; SUPINE AND UPRIGHT ABDOMEN COMPARISON:  CT abdomen and pelvis February 17, 2020 FINDINGS: PA chest: Lungs are clear. Heart size and pulmonary vascularity are normal. No adenopathy. Supine and upright abdomen: No bowel dilatation. Occasional air-fluid levels in the right abdomen. No demonstrable free air. Probable small phleboliths in right pelvis. Screw in proximal left femur. IMPRESSION: Scattered air-fluid levels may represent enteritis or ileus. Bowel obstruction felt to be less likely. No free air demonstrable on this study. Lungs clear. Electronically Signed   By: Lowella Grip III M.D.   On: 02/18/2020 10:13   Medications: I have reviewed the patient's current medications. Scheduled Meds: . enoxaparin (LOVENOX) injection  40 mg Subcutaneous Q24H  . pantoprazole (PROTONIX) IV  40 mg Intravenous Q24H   Continuous Infusions: . piperacillin-tazobactam (ZOSYN)  IV 3.375 g (02/19/20 1448)   PRN Meds:.fluticasone, hydrALAZINE, morphine injection, ondansetron **OR** ondansetron (ZOFRAN) IV   Assessment: Principal Problem:   Inflammatory bowel disease Active Problems:   Hypertension    Plan: This patient was admitted with a finding of microperforations and possible inflammatory bowel disease representing Crohn's disease.  The patient did test positive for E. coli and is presently on antibiotics.  The patient will have a repeat CT scan that has been ordered by surgery for tomorrow.  If the CT scan is improved the patient can be switched over to oral antibiotics and sent home with outpatient follow-up.  If the CT scan is  worse the patient may need a longer course of antibiotics IV.  The patient is feeling much better today and is eager to go home.  The patient has been explained the plan agrees with it.   LOS: 2 days   Lewayne Bunting 02/19/2020, 3:17 PM Pager [phone] 7am-5pm  Check AMION for 5pm -7am coverage and on weekends

## 2020-02-19 NOTE — Progress Notes (Addendum)
[facility] SURGICAL PROGRESS NOTE (cpt [id number])  Hospital Day(s): 2.   Interval History:  Patient seen and examined, no acute events or new complaints overnight. Patient reports he is doing well. He had one mild episode of abdominal pain yesterday but this resolved. This morning, he denies fever, chills, nausea, emesis. No new labs this morning. Seen by GI, no planned procedures as expected given perforation. Will hold off on steroids as to not interfere with healing. Started on CLD and he has tolerated well. No further complaints.   Review of Systems:  Constitutional: denies fever, chills  HEENT: denies cough or congestion  Respiratory: denies any shortness of breath  Cardiovascular: denies chest pain or palpitations  Gastrointestinal: + abdominal pain(improved), denied N/V/D Genitourinary: denies burning with urination or urinary frequency Musculoskeletal: denies pain, decreased motor or sensation  Vital signs in last 24 hours: [min-max] current  Temp:  [98.2 F (36.8 C)-99.4 F (37.4 C)] 98.3 F (36.8 C) (10/07 0601) Pulse Rate:  [63-74] 63 (10/07 0601) Resp:  [15-18] 16 (10/07 0601) BP: (96-122)/(59-78) 116/68 (10/07 0601) SpO2:  [96 %-98 %] 97 % (10/07 0601)     Height: 6\' 1"  (185.4 cm) Weight: 104.3 kg BMI (Calculated): 30.35   Intake/Output last 2 shifts:  10/06 0701 - 10/07 0700 In: 2088.6 [I.V.:1959.2; IV Piggyback:129.4] Out: 975 [Urine:975]   Physical Exam:  Constitutional: alert, cooperative and no distress  HENT: normocephalic without obvious abnormality  Eyes: PERRL, EOM's grossly intact and symmetric  Respiratory: breathing non-labored at rest  Cardiovascular: regular rate and sinus rhythm  Gastrointestinal: soft, non-tender, and non-distended, no rebound/guarding/peritonitis Musculoskeletal: No edema or wounds, motor and sensation grossly intact, NT    Labs:  CBC Latest Ref Rng & Units 02/18/2020 02/17/2020 05/23/2018  WBC 4.0 - 10.5 K/uL 10.5  13.1(H) 8.7  Hemoglobin 13.0 - 17.0 g/dL 14.5 14.8 15.7  Hematocrit 39 - 52 % 39.1 41.1 44.9  Platelets 150 - 400 K/uL 248 261 306   CMP Latest Ref Rng & Units 02/18/2020 02/17/2020 08/07/2019  Glucose 70 - 99 mg/dL 98 91 77  BUN 6 - 20 mg/dL 9 10 11   Creatinine 0.61 - 1.24 mg/dL 1.13 0.97 0.99  Sodium 135 - 145 mmol/L 136 133(L) 139  Potassium 3.5 - 5.1 mmol/L 3.5 3.8 4.4  Chloride 98 - 111 mmol/L 100 98 101  CO2 22 - 32 mmol/L 25 25 24   Calcium 8.9 - 10.3 mg/dL 9.0 9.0 9.3  Total Protein 6.5 - 8.1 g/dL - 7.9 7.4  Total Bilirubin 0.3 - 1.2 mg/dL - 1.2 0.8  Alkaline Phos 38 - 126 U/L - 58 65  AST 15 - 41 U/L - 14(L) 21  ALT 0 - 44 U/L - 18 24    Imaging studies: No new pertinent imaging studies   Assessment/Plan: (ICD-10's: K52.9) 39 y.o. male with improvement/resoltuion in abdominal pain, on admission found to have terminal ileum inflammation and contained microperforation likely undiagnosed IBD   - Advance to full liquids today  - Appreciate GI recommendations; hold on endoscopic evaluation given perforation, hold steroids and immunosuppressants for now until definitive diagnosis  - Continue IV ABx (Zosyn); Day 2/14  - We will repeat CT Abdomen/Pelvis tomorrow (10/08)             - Monitor abdominal examination; on-going bowel function             - Pain control prn; antiemetics prn             -  No need for emergent surgical intervention   - Mobilization as toelrates              - Further management per primary service; we will follow  All of the above findings and recommendations were discussed with the patient, and the medical team, and all of patient's questions were answered to his expressed satisfaction.   -- Edison Simon, PA-C [facility] 02/19/2020, 7:31 AM [phone] M-F: 7am - 4pm

## 2020-02-19 NOTE — Telephone Encounter (Signed)
Requested Prescriptions  Pending Prescriptions Disp Refills  . amLODipine (NORVASC) 10 MG tablet [Pharmacy Med Name: AMLODIPINE BESYLATE 10MG  TABLETS] 90 tablet 0    Sig: TAKE 1 TABLET(10 MG) BY MOUTH DAILY     Cardiovascular:  Calcium Channel Blockers Failed - 02/19/2020  3:36 AM      Failed - Valid encounter within last 6 months    Recent Outpatient Visits          6 months ago Benign hypertensive renal disease   [facility] [place], [place], [place]   11 months ago Viral URI   [facility] [place], [place]   1 year ago Routine general medical examination at a health care facility   [facility], [place], DO   1 year ago Palpitations   [place], [place], DO   2 years ago Routine general medical examination at a health care facility   [place], Barb Mer[place], DO      Future Appointments            In 1 week Kathrine Haddock, NP [facility], [place]   In 1 month [place], Megan P, DO [place], [facility]           Passed - Last BP in normal range    BP Readings from Last 1 Encounters:  02/19/20 116/68         Appointment 02/26/20 Courtesy refill

## 2020-02-20 ENCOUNTER — Encounter: Payer: Self-pay | Admitting: Internal Medicine

## 2020-02-20 ENCOUNTER — Inpatient Hospital Stay: Payer: 59

## 2020-02-20 DIAGNOSIS — K50818 Crohn's disease of both small and large intestine with other complication: Secondary | ICD-10-CM

## 2020-02-20 LAB — CALPROTECTIN, FECAL: Calprotectin, Fecal: 170 ug/g — ABNORMAL HIGH (ref 0–120)

## 2020-02-20 MED ORDER — IOHEXOL 9 MG/ML PO SOLN
1000.0000 mL | ORAL | Status: AC
  Administered 2020-02-20 (×2): 1000 mL via ORAL

## 2020-02-20 MED ORDER — CIPROFLOXACIN HCL 500 MG PO TABS: 500.0000 mg | ORAL_TABLET | Freq: Two times a day (BID) | ORAL | 0 refills | Status: AC

## 2020-02-20 MED ORDER — METRONIDAZOLE 500 MG PO TABS: 500.0000 mg | ORAL_TABLET | Freq: Three times a day (TID) | ORAL | 0 refills | Status: AC

## 2020-02-20 MED ORDER — IOHEXOL 300 MG/ML  SOLN
100.0000 mL | Freq: Once | INTRAMUSCULAR | Status: AC | PRN
  Administered 2020-02-20: 100 mL via INTRAVENOUS

## 2020-02-20 NOTE — Progress Notes (Signed)
CC: chron;s Subjective: No complaints, took fulls , had BM, no N/V CT pers reviewed and no new collection, persistent inflammation around TI, no free air   Objective: Vital signs in last 24 hours: Temp:  [97.5 F (36.4 C)-98.7 F (37.1 C)] 97.5 F (36.4 C) (10/08 0513) Pulse Rate:  [54-67] 54 (10/08 0513) Resp:  [16-20] 16 (10/08 0513) BP: (109-125)/(73-84) 110/74 (10/08 0513) SpO2:  [97 %-99 %] 97 % (10/08 0513) Last BM Date: 02/19/20  Intake/Output from previous day: 10/07 0701 - 10/08 0700 In: 660 [P.O.:660] Out: -  Intake/Output this shift: Total I/O In: 120 [P.O.:120] Out: -   Physical exam: NAD, alert Abd: soft, nt, no peritonitis Ext;well perfused and no edema   Lab Results: CBC  Recent Labs    02/18/20 0410 02/19/20 0852  WBC 10.5 9.5  HGB 14.5 14.4  HCT 39.1 41.3  PLT 248 269   BMET Recent Labs    02/18/20 0410 02/19/20 0852  NA 136 135  K 3.5 4.2  CL 100 100  CO2 25 27  GLUCOSE 98 150*  BUN 9 5*  CREATININE 1.13 1.07  CALCIUM 9.0 9.0   PT/INR No results for input(s): LABPROT, INR in the last 72 hours. ABG No results for input(s): PHART, HCO3 in the last 72 hours.  Invalid input(s): PCO2, PO2  Studies/Results: No results found.  Anti-infectives: Anti-infectives (From admission, onward)   Start     Dose/Rate Route Frequency Ordered Stop   02/20/20 0000  ciprofloxacin (CIPRO) 500 MG tablet        500 mg Oral 2 times daily 02/20/20 1051 03/01/20 2359   02/20/20 0000  metroNIDAZOLE (FLAGYL) 500 MG tablet        500 mg Oral 3 times daily 02/20/20 1051 03/01/20 2359   02/17/20 2200  piperacillin-tazobactam (ZOSYN) IVPB 3.375 g        3.375 g 12.5 mL/hr over 240 Minutes Intravenous Every 8 hours 02/17/20 1738     02/17/20 1700  piperacillin-tazobactam (ZOSYN) IVPB 3.375 g        3.375 g 100 mL/hr over 30 Minutes Intravenous  Once 02/17/20 1649 02/17/20 1808      Assessment/Plan:  Chron's w microperforation Abdomen completely  benign Repeat CT no collections May DC home w PO cipro and flagyl F/U 2 weeks GI to follow as well I spent > 35 min in this encounter w > 50% spent in coordination and counseling  Caroleen Hamman, MD, FACS  02/20/2020

## 2020-02-20 NOTE — Discharge Summary (Signed)
Physician Discharge Summary  Gordon Carson OIN:[id number] DOB: 07-30-1980 DOA: 02/17/2020  PCP: Valerie Roys, DO  Admit date: 02/17/2020 Discharge date: 02/20/2020  Admitted From: Home Disposition:  Home  Discharge Condition:Stable CODE STATUS:Full Diet recommendation: Heart Healthy  Brief/Interim Summary:  Patient is a 39 year old male with history of hypertension, anxiety disorder who presents to the emergency department with complaints of abdominal pain mainly in the periumbilical and right lower quadrant associated with anorexia, bloating, loose watery stools for about 5 days.  He would have chills but no fever.  CT abdomen/pelvis done on presentation showed possible small bowel perforation on the right lower quadrant in the setting of inflammatory disease.  No history of Crohn's or ulcerative colitis in the past.  GI, general surgery consulted.  Started on antibiotics.  No plan for emergent surgery or colonoscopy.  His overall condition significantly improved.  Currently he is abdominal pain-free, tolerating diet, having bowel movements.  He is medically stable for discharge home today with oral antibiotics.  He will follow-up with gastroenterology as an outpatient for evaluation of possible Crohn's disease.  Following problems were addressed during his hospitalization:  Suspected inflammatory bowel disease/small bowel perforation: Presented with abdomen pain, bloating, chills.  No history of inflammatory bowel disease in the past.  Imagings were suggestive of possible perforation in RLQ/the small bowel.  Started on antibiotic, IV fluids, pain management. GI, general surgery consulted.No plan for emergent surgery or colonoscopy. We will plan to continue antibiotics for total of 2 weeks.  He does not complain of any abdomen pain this morning.  Had a good bowel movement today. Diet advanced GI pathogen panel showed enteropathogenic E. Coli.  He was having diarrhea before presentation.   Continue current antibiotics. He will follow-up with gastroenterology as an outpatient for evaluation of possible Crohn's disease.  Hypertension: Currently blood pressure stable.    Takes amlodipine and lisinopril at home.  Discharge Diagnoses:  Principal Problem:   Inflammatory bowel disease Active Problems:   Hypertension    Discharge Instructions  Discharge Instructions    Diet - low sodium heart healthy   Complete by: As directed    Discharge instructions   Complete by: As directed    1)Please take prescribed medications as instructed. 2)Follow up with your PCP in 1 to 2 weeks. 3)You will be called by gastroenterology for follow-up appointment.   Increase activity slowly   Complete by: As directed      Allergies as of 02/20/2020      Reactions   Codeine Nausea And Vomiting      Medication List    TAKE these medications   amLODipine 10 MG tablet Commonly known as: NORVASC TAKE 1 TABLET(10 MG) BY MOUTH DAILY What changed: See the new instructions.   ciprofloxacin 500 MG tablet Commonly known as: CIPRO Take 1 tablet (500 mg total) by mouth 2 (two) times daily for 10 days.   fluticasone 50 MCG/ACT nasal spray Commonly known as: FLONASE Place 2 sprays into both nostrils 2 (two) times daily as needed for allergies or rhinitis.   lisinopril 5 MG tablet Commonly known as: ZESTRIL TAKE 1 TABLET(5 MG) BY MOUTH DAILY   metroNIDAZOLE 500 MG tablet Commonly known as: Flagyl Take 1 tablet (500 mg total) by mouth 3 (three) times daily for 10 days.       Follow-up Information    Park Liter P, DO. Schedule an appointment as soon as possible for a visit in 1 week(s).   Specialty: Family Medicine  Contact information: [address] [place] [address] [phone]              Allergies  Allergen Reactions  . Codeine Nausea And Vomiting    Consultations: GI, general surgery  Procedures/Studies: CT ABDOMEN PELVIS W CONTRAST  Result Date:  02/17/2020 CLINICAL DATA:  Abdominal pain EXAM: CT ABDOMEN AND PELVIS WITH CONTRAST TECHNIQUE: Multidetector CT imaging of the abdomen and pelvis was performed using the standard protocol following bolus administration of intravenous contrast. CONTRAST:  156mL OMNIPAQUE IOHEXOL 300 MG/ML  SOLN COMPARISON:  None. FINDINGS: Lower chest: No acute abnormality.  Bibasilar atelectasis. Hepatobiliary: Focal fatty deposition adjacent to the falciform ligament. Portal and hepatic veins are patent. Gallbladder is unremarkable. No intrahepatic or extrahepatic biliary ductal dilation. Pancreas: Unremarkable. No pancreatic ductal dilatation or surrounding inflammatory changes. Spleen: Normal in size without focal abnormality.  Splenules. Adrenals/Urinary Tract: Adrenal glands are unremarkable. Nonobstructive LEFT-sided nephrolithiasis. Kidneys enhance symmetrically. Bladder is unremarkable. Stomach/Bowel: There is segmental bowel wall thickening and mucosal enhancement within a loop of small bowel in the RIGHT lower quadrant which is closely adherent to the cecum and terminal ileum. There is robust adjacent fat stranding. There are several punctate foci of air which are not definitively intraluminal in location (series 5, image 41). There is apparent focal wall defect in this region. The terminal ileum is diffusely thick walled. There is apparent tethering of the mesentery in the RIGHT lower quadrant with creeping fat extending from bowel wall thickening of loops of small bowel in this region. Status post appendectomy. No evidence of bowel obstruction. Vascular/Lymphatic: Aorta is normal in appearance. Prominent mesenteric lymph nodes with representative lymph node measuring 9 mm (series 2, image 45). Reproductive: Prostate is present. Other: Small volume free fluid in the pelvis. Musculoskeletal: Status post LEFT femoral medullary nail. IMPRESSION: 1. Constellation of findings are concerning for focal RIGHT lower quadrant small  bowel perforation in the setting of inflammatory bowel disease. No focal organized drainable fluid collection. 2. Small volume free fluid in the pelvis. These results were called by telephone at the time of interpretation on 02/17/2020 at 4:30 pm to provider [facility] , who verbally acknowledged these results. Electronically Signed   By: Valentino Saxon MD   On: 02/17/2020 16:33   DG ABD ACUTE 2+V W 1V CHEST  Result Date: 02/18/2020 CLINICAL DATA:  Inflammatory bowel disease EXAM: X-RAY PA CHEST; SUPINE AND UPRIGHT ABDOMEN COMPARISON:  CT abdomen and pelvis February 17, 2020 FINDINGS: PA chest: Lungs are clear. Heart size and pulmonary vascularity are normal. No adenopathy. Supine and upright abdomen: No bowel dilatation. Occasional air-fluid levels in the right abdomen. No demonstrable free air. Probable small phleboliths in right pelvis. Screw in proximal left femur. IMPRESSION: Scattered air-fluid levels may represent enteritis or ileus. Bowel obstruction felt to be less likely. No free air demonstrable on this study. Lungs clear. Electronically Signed   By: Lowella Grip III M.D.   On: 02/18/2020 10:13       Subjective: Patient seen and examined at the bedside this morning.  Hemodynamically stable for discharge.  Discharge Exam: Vitals:   02/20/20 0048 02/20/20 0513  BP: 109/73 110/74  Pulse: (!) 58 (!) 54  Resp: 20 16  Temp: 98 F (36.7 C) (!) 97.5 F (36.4 C)  SpO2: 97% 97%   Vitals:   02/19/20 1623 02/19/20 2050 02/20/20 0048 02/20/20 0513  BP: 111/81 125/84 109/73 110/74  Pulse: 62 67 (!) 58 (!) 54  Resp: 16 18  20 16  Temp: 98 F (36.7 C) 98.7 F (37.1 C) 98 F (36.7 C) (!) 97.5 F (36.4 C)  TempSrc: Oral Oral Oral Oral  SpO2: 99% 97% 97% 97%  Weight:      Height:        General: Pt is alert, awake, not in acute distress Cardiovascular: RRR, S1/S2 +, no rubs, no gallops Respiratory: CTA bilaterally, no wheezing, no rhonchi Abdominal: Soft, NT, ND, bowel sounds  + Extremities: no edema, no cyanosis    The results of significant diagnostics from this hospitalization (including imaging, microbiology, ancillary and laboratory) are listed below for reference.     Microbiology: Recent Results (from the past 240 hour(s))  Respiratory Panel by RT PCR (Flu A&B, Covid) - Nasopharyngeal Swab     Status: None   Collection Time: 02/17/20  5:14 PM   Specimen: Nasopharyngeal Swab  Result Value Ref Range Status   SARS Coronavirus 2 by RT PCR NEGATIVE NEGATIVE Final    Comment: (NOTE) SARS-CoV-2 target nucleic acids are NOT DETECTED.  The SARS-CoV-2 RNA is generally detectable in upper respiratoy specimens during the acute phase of infection. The lowest concentration of SARS-CoV-2 viral copies this assay can detect is 131 copies/mL. A negative result does not preclude SARS-Cov-2 infection and should not be used as the sole basis for treatment or other patient management decisions. A negative result may occur with  improper specimen collection/handling, submission of specimen other than nasopharyngeal swab, presence of viral mutation(s) within the areas targeted by this assay, and inadequate number of viral copies (<131 copies/mL). A negative result must be combined with clinical observations, patient history, and epidemiological information. The expected result is Negative.  Fact Sheet for Patients:  PinkCheek.be  Fact Sheet for Healthcare Providers:  GravelBags.it  This test is no t yet approved or cleared by the [place] FDA and  has been authorized for detection and/or diagnosis of SARS-CoV-2 by FDA under an Emergency Use Authorization (EUA). This EUA will remain  in effect (meaning this test can be used) for the duration of the COVID-19 declaration under Section 564(b)(1) of the Act, 21 U.S.C. section 360bbb-3(b)(1), unless the authorization is terminated or revoked sooner.      Influenza A by PCR NEGATIVE NEGATIVE Final   Influenza B by PCR NEGATIVE NEGATIVE Final    Comment: (NOTE) The Xpert Xpress SARS-CoV-2/FLU/RSV assay is intended as an aid in  the diagnosis of influenza from Nasopharyngeal swab specimens and  should not be used as a sole basis for treatment. Nasal washings and  aspirates are unacceptable for Xpert Xpress SARS-CoV-2/FLU/RSV  testing.  Fact Sheet for Patients: PinkCheek.be  Fact Sheet for Healthcare Providers: GravelBags.it  This test is not yet approved or cleared by the [place] FDA and  has been authorized for detection and/or diagnosis of SARS-CoV-2 by  FDA under an Emergency Use Authorization (EUA). This EUA will remain  in effect (meaning this test can be used) for the duration of the  Covid-19 declaration under Section 564(b)(1) of the Act, 21  U.S.C. section 360bbb-3(b)(1), unless the authorization is  terminated or revoked. Performed at [facility], [address] [place], [place] [address]   Gastrointestinal Panel by PCR , Stool     Status: Abnormal   Collection Time: 02/19/20  9:20 AM   Specimen: Stool  Result Value Ref Range Status   Campylobacter species NOT DETECTED NOT DETECTED Final   Plesimonas shigelloides NOT DETECTED NOT DETECTED Final   Salmonella species  NOT DETECTED NOT DETECTED Final   Yersinia enterocolitica NOT DETECTED NOT DETECTED Final   Vibrio species NOT DETECTED NOT DETECTED Final   Vibrio cholerae NOT DETECTED NOT DETECTED Final   Enteroaggregative E coli (EAEC) NOT DETECTED NOT DETECTED Final   Enteropathogenic E coli (EPEC) DETECTED (A) NOT DETECTED Final    Comment: RESULT CALLED TO, READ BACK BY AND VERIFIED WITH: TINA LEACH AT 1247 ON 02/19/2020 [place].    Enterotoxigenic E coli (ETEC) NOT DETECTED NOT DETECTED Final   Shiga like toxin producing E coli (STEC) NOT DETECTED NOT DETECTED Final   Shigella/Enteroinvasive E  coli (EIEC) NOT DETECTED NOT DETECTED Final   Cryptosporidium NOT DETECTED NOT DETECTED Final   Cyclospora cayetanensis NOT DETECTED NOT DETECTED Final   Entamoeba histolytica NOT DETECTED NOT DETECTED Final   Giardia lamblia NOT DETECTED NOT DETECTED Final   Adenovirus F40/41 NOT DETECTED NOT DETECTED Final   Astrovirus NOT DETECTED NOT DETECTED Final   Norovirus GI/GII NOT DETECTED NOT DETECTED Final   Rotavirus A NOT DETECTED NOT DETECTED Final   Sapovirus (I, II, IV, and V) NOT DETECTED NOT DETECTED Final    Comment: Performed at [facility], [address] [place], [place] [address]     Labs: BNP (last 3 results) No results for input(s): BNP in the last 8760 hours. Basic Metabolic Panel: Recent Labs  Lab 02/17/20 1351 02/18/20 0410 02/19/20 0852  NA 133* 136 135  K 3.8 3.5 4.2  CL 98 100 100  CO2 25 25 27   GLUCOSE 91 98 150*  BUN 10 9 5*  CREATININE 0.97 1.13 1.07  CALCIUM 9.0 9.0 9.0   Liver Function Tests: Recent Labs  Lab 02/17/20 1351  AST 14*  ALT 18  ALKPHOS 58  BILITOT 1.2  PROT 7.9  ALBUMIN 4.3   Recent Labs  Lab 02/17/20 1351  LIPASE 25   No results for input(s): AMMONIA in the last 168 hours. CBC: Recent Labs  Lab 02/17/20 1351 02/18/20 0410 02/19/20 0852  WBC 13.1* 10.5 9.5  HGB 14.8 14.5 14.4  HCT 41.1 39.1 41.3  MCV 85.3 84.6 87.1  PLT 261 248 269   Cardiac Enzymes: No results for input(s): CKTOTAL, CKMB, CKMBINDEX, TROPONINI in the last 168 hours. BNP: Invalid input(s): POCBNP CBG: No results for input(s): GLUCAP in the last 168 hours. D-Dimer No results for input(s): DDIMER in the last 72 hours. Hgb A1c No results for input(s): HGBA1C in the last 72 hours. Lipid Profile No results for input(s): CHOL, HDL, LDLCALC, TRIG, CHOLHDL, LDLDIRECT in the last 72 hours. Thyroid function studies No results for input(s): TSH, T4TOTAL, T3FREE, THYROIDAB in the last 72 hours.  Invalid input(s): FREET3 Anemia work up No  results for input(s): VITAMINB12, FOLATE, FERRITIN, TIBC, IRON, RETICCTPCT in the last 72 hours. Urinalysis    Component Value Date/Time   COLORURINE YELLOW (A) 02/17/2020 1351   APPEARANCEUR CLEAR (A) 02/17/2020 1351   APPEARANCEUR Clear 06/20/2018 0823   LABSPEC 1.013 02/17/2020 1351   PHURINE 6.0 02/17/2020 1351   GLUCOSEU NEGATIVE 02/17/2020 1351   HGBUR MODERATE (A) 02/17/2020 1351   BILIRUBINUR NEGATIVE 02/17/2020 1351   BILIRUBINUR Negative 06/20/2018 0823   KETONESUR 20 (A) 02/17/2020 1351   PROTEINUR NEGATIVE 02/17/2020 1351   NITRITE NEGATIVE 02/17/2020 1351   LEUKOCYTESUR SMALL (A) 02/17/2020 1351   Sepsis Labs Invalid input(s): PROCALCITONIN,  WBC,  LACTICIDVEN Microbiology Recent Results (from the past 240 hour(s))  Respiratory Panel by RT PCR (Flu A&B, Covid) -  Nasopharyngeal Swab     Status: None   Collection Time: 02/17/20  5:14 PM   Specimen: Nasopharyngeal Swab  Result Value Ref Range Status   SARS Coronavirus 2 by RT PCR NEGATIVE NEGATIVE Final    Comment: (NOTE) SARS-CoV-2 target nucleic acids are NOT DETECTED.  The SARS-CoV-2 RNA is generally detectable in upper respiratoy specimens during the acute phase of infection. The lowest concentration of SARS-CoV-2 viral copies this assay can detect is 131 copies/mL. A negative result does not preclude SARS-Cov-2 infection and should not be used as the sole basis for treatment or other patient management decisions. A negative result may occur with  improper specimen collection/handling, submission of specimen other than nasopharyngeal swab, presence of viral mutation(s) within the areas targeted by this assay, and inadequate number of viral copies (<131 copies/mL). A negative result must be combined with clinical observations, patient history, and epidemiological information. The expected result is Negative.  Fact Sheet for Patients:  PinkCheek.be  Fact Sheet for Healthcare  Providers:  GravelBags.it  This test is no t yet approved or cleared by the [place] FDA and  has been authorized for detection and/or diagnosis of SARS-CoV-2 by FDA under an Emergency Use Authorization (EUA). This EUA will remain  in effect (meaning this test can be used) for the duration of the COVID-19 declaration under Section 564(b)(1) of the Act, 21 U.S.C. section 360bbb-3(b)(1), unless the authorization is terminated or revoked sooner.     Influenza A by PCR NEGATIVE NEGATIVE Final   Influenza B by PCR NEGATIVE NEGATIVE Final    Comment: (NOTE) The Xpert Xpress SARS-CoV-2/FLU/RSV assay is intended as an aid in  the diagnosis of influenza from Nasopharyngeal swab specimens and  should not be used as a sole basis for treatment. Nasal washings and  aspirates are unacceptable for Xpert Xpress SARS-CoV-2/FLU/RSV  testing.  Fact Sheet for Patients: PinkCheek.be  Fact Sheet for Healthcare Providers: GravelBags.it  This test is not yet approved or cleared by the [place] FDA and  has been authorized for detection and/or diagnosis of SARS-CoV-2 by  FDA under an Emergency Use Authorization (EUA). This EUA will remain  in effect (meaning this test can be used) for the duration of the  Covid-19 declaration under Section 564(b)(1) of the Act, 21  U.S.C. section 360bbb-3(b)(1), unless the authorization is  terminated or revoked. Performed at [facility], [address] [place], [place] [address]   Gastrointestinal Panel by PCR , Stool     Status: Abnormal   Collection Time: 02/19/20  9:20 AM   Specimen: Stool  Result Value Ref Range Status   Campylobacter species NOT DETECTED NOT DETECTED Final   Plesimonas shigelloides NOT DETECTED NOT DETECTED Final   Salmonella species NOT DETECTED NOT DETECTED Final   Yersinia enterocolitica NOT DETECTED NOT DETECTED Final   Vibrio  species NOT DETECTED NOT DETECTED Final   Vibrio cholerae NOT DETECTED NOT DETECTED Final   Enteroaggregative E coli (EAEC) NOT DETECTED NOT DETECTED Final   Enteropathogenic E coli (EPEC) DETECTED (A) NOT DETECTED Final    Comment: RESULT CALLED TO, READ BACK BY AND VERIFIED WITH: TINA LEACH AT 1247 ON 02/19/2020 [place].    Enterotoxigenic E coli (ETEC) NOT DETECTED NOT DETECTED Final   Shiga like toxin producing E coli (STEC) NOT DETECTED NOT DETECTED Final   Shigella/Enteroinvasive E coli (EIEC) NOT DETECTED NOT DETECTED Final   Cryptosporidium NOT DETECTED NOT DETECTED Final   Cyclospora cayetanensis NOT DETECTED NOT DETECTED Final  Entamoeba histolytica NOT DETECTED NOT DETECTED Final   Giardia lamblia NOT DETECTED NOT DETECTED Final   Adenovirus F40/41 NOT DETECTED NOT DETECTED Final   Astrovirus NOT DETECTED NOT DETECTED Final   Norovirus GI/GII NOT DETECTED NOT DETECTED Final   Rotavirus A NOT DETECTED NOT DETECTED Final   Sapovirus (I, II, IV, and V) NOT DETECTED NOT DETECTED Final    Comment: Performed at [facility], [address], [place], [place] [address]    Please note: You were cared for by a hospitalist during your hospital stay. Once you are discharged, your primary care physician will handle any further medical issues. Please note that NO REFILLS for any discharge medications will be authorized once you are discharged, as it is imperative that you return to your primary care physician (or establish a relationship with a primary care physician if you do not have one) for your post hospital discharge needs so that they can reassess your need for medications and monitor your lab values.    Time coordinating discharge: 40 minutes  SIGNED:   Shelly Coss, MD  [facility] 02/20/2020, 10:51 AM Pager [phone]  If 7PM-7AM, please contact night-coverage [email or web address] Password [email or web address]

## 2020-02-20 NOTE — Discharge Instructions (Signed)
Chronic Diarrhea Chronic diarrhea is a condition in which a person passes frequent loose and watery stools for 4 weeks or longer. Non-chronic diarrhea usually lasts for only 2-3 days. Diarrhea can cause a person to feel weak and dehydrated. Dehydration can make the person tired and thirsty. It can also cause a dry mouth, decreased urination, and dark yellow urine. Diarrhea is a sign of an underlying problem, such as:  Infection.  Side effects of medicines.  Problems digesting something in your diet, such as milk products if you have lactose intolerance.  Conditions such as celiac disease, irritable bowel syndrome (IBS), or inflammatory bowel disease (IBD). If you have chronic diarrhea, make sure you treat it as told by your health care provider. Follow these instructions at home: Medicines  Take over-the-counter and prescription medicines only as told by your health care provider.  If you were prescribed an antibiotic medicine, take it as told by your health care provider. Do not stop taking the antibiotic even if you start to feel better. Eating and drinking   Follow instructions from your health care provider about what to eat and drink. You may have to: ? Avoid foods that trigger diarrhea for you. ? Take an oral rehydration solution (ORS). This is a drink that keeps you hydrated. It can be found at pharmacies and retail stores. ? Drink clear fluids, such as water, diluted fruit juice, and low-calorie sports drinks. You can also get fluids by sucking on ice chips. ? Drink enough fluid to keep your urine pale yellow. This will help you avoid dehydration. ? Eat small amounts of bland foods that are easy to digest as you are able. These foods include bananas, applesauce, rice, lean meats, toast, and crackers. ? Avoid spicy or fatty foods. ? Avoid foods and beverages that contain a lot of sugar or caffeine.  Do not drink alcohol if: ? Your health care provider tells you not to  drink. ? You are pregnant, may be pregnant, or are planning to become pregnant.  If you drink alcohol: ? Limit how much you use to:  0-1 drink a day for women.  0-2 drinks a day for men. ? Be aware of how much alcohol is in your drink. In the [place] one drink equals one 12 oz bottle of beer (355 mL), one 5 oz glass of wine (148 mL), or one 1 oz glass of hard liquor (44 mL). General instructions   Wash your hands often and after each diarrhea episode. Use soap and water. If soap and water are not available, use hand sanitizer.  Make sure that all people in your household wash their hands well and often.  Rest as told by your health care provider.  Watch your condition for any changes.  Take a warm bath to relieve any burning or pain from frequent diarrhea episodes.  Keep all follow-up visits as told by your health care provider. This is important. Contact a health care provider if:  You have a fever.  Your diarrhea gets worse or does not get better.  You have new symptoms.  You cannot drink fluid without vomiting.  You feel light-headed or dizzy.  You have a headache.  You have muscle cramps.  You have severe pain in the rectum. Get help right away if:  You have vomiting that does not go away.  You have chest pain.  You feel very weak or you faint.  You have bloody or black stools, or stools that look like tar.  You have severe pain, cramping, or bloating in your abdomen, or pain that stays in one place.  You have trouble breathing or you are breathing very quickly.  Your heart is beating very quickly.  Your skin feels cold and clammy.  You feel confused.  You have a severe headache.  You have signs or symptoms of dehydration, such as: ? Dark urine, very little urine, or no urine. ? Cracked lips. ? Dry mouth. ? Sunken eyes. ? Sleepiness. ? Weakness. These symptoms may represent a serious problem that is an emergency. Do not wait to see if the  symptoms will go away. Get medical help right away. Call your local emergency services (911 in the [place] Do not drive yourself to the hospital. Summary  Chronic diarrhea is a condition in which a person passes frequent loose and watery stools for 4 weeks or longer.  Diarrhea is a sign of an underlying problem.  Make sure you treat your diarrhea as told by your health care provider.  Drink enough fluid to keep your urine pale yellow. This will help you avoid dehydration.  Wash your hands often and after each diarrhea episode. If soap and water are not available, use hand sanitizer. This information is not intended to replace advice given to you by your health care provider. Make sure you discuss any questions you have with your health care provider. Document Revised: 10/28/2018 Document Reviewed: 10/28/2018 Elsevier Patient Education  [place]  Crohn's disease is a long-lasting (chronic) disease that affects the gastrointestinal (GI) tract. Crohn's disease often causes irritation and inflammation in the small intestine and the beginning of the large intestine, but it can affect any part of the GI tract. Crohn's disease is part of a group of illnesses that are known as inflammatory bowel disease (IBD). Crohn's disease may start slowly and get worse over time. Symptoms may come and go. They may also go away for months or even years at a time (remission). What are the causes? The exact cause of this condition is not known. It may involve a response that causes your body's disease-fighting (immune) system to attack healthy cells and tissues (autoimmune response). Bacteria, genes, and your environment may also play a role. What increases the risk? The following factors may make you more likely to develop this condition:  Having a family member who has Crohn's disease, another IBD, or an autoimmune condition.  Using products that contain nicotine or tobacco, such as  cigarettes and e-cigarettes.  Being in your 75s.  Having [place] European ancestry. What are the signs or symptoms? The main symptoms of this condition involve your GI tract. These include:  Diarrhea.  Pain or cramping in the abdomen. This is commonly felt in the lower right side of the abdomen.  Frequent watery or bloody stools.  Constipation. This may mean having: ? Fewer bowel movements in a week than normal. ? Difficulty having a bowel movement. ? Stools that are dry, hard, or larger than normal.  Rectal bleeding.  Rectal pain.  An urgent need to have a bowel movement.  The feeling that you are not finished having a bowel movement. Other symptoms may include:  Unexplained weight loss.  Fatigue.  Fever.  Nausea.  Loss of appetite.  Joint pain.  Vision changes.  Red bumps or sores on the skin.  Sores inside the mouth. How is this diagnosed? This condition may be diagnosed based on:  Your symptoms and your medical history.  A physical exam.  Tests, which may include: ? Blood tests. ? Stool sample tests. ? Imaging tests, such as X-rays and CT scans. ? Tests to examine the inside of your intestines using a long, flexible tube that has a light and a camera on the end (endoscopy or colonoscopy). ? A procedure to remove tissue samples from inside your bowel for testing (biopsy). You may need to work with a health care provider who specializes in diseases of the digestive tract (gastroenterologist). How is this treated? There is no cure for this condition, but treatment can help you manage your symptoms. Crohn's disease affects each person differently. Your treatment may include:  Resting your bowels. This involves having a period of healing time when your bowels are not passing stools. This may be done by: ? Drinking only clear liquids. These are liquids that you can see through, such as water, black coffee, fruit juice without pulp, broth, gelatin, and ice  pops. ? Getting nutrition through an IV for a period of time.  Medicines. These may be used by themselves or with other treatments (combination therapy). These may include antibiotic medicines. You may be given medicines that help to: ? Reduce inflammation. ? Control your immune system activity. ? Fight infections. ? Relieve cramps and prevent diarrhea. ? Control your pain.  Surgery. You may need surgery if: ? Medicines and other treatments are not working anymore. ? You develop complications from severe Crohn's disease. ? A section of your intestine becomes so damaged that it needs to be removed. Follow these instructions at home: Medicines  Take over-the-counter and prescription medicines only as told by your health care provider.  If you were prescribed an antibiotic, take it as told by your health care provider. Do not stop taking the antibiotic even if you start to feel better.  Avoid taking ibuprofen or other NSAID medicines if possible, these can make Crohn's disease worse. Eating and drinking  Talk with your health care provider or a diet and nutrition specialist (registered dietitian) about what diet is best for you.  Drink enough fluid to keep your urine pale yellow.  If you are taking steroids to reduce inflammation, get plenty of calcium in your diet to help keep your bones healthy. You may also consider taking a calcium supplement with vitamin D.  Keep a food diary to identify foods that make your symptoms better or worse.  Avoid foods that cause symptoms.  Follow instructions from your health care provider about eating or drinking restrictions if you have worsening symptoms (flare-up).  Limit alcohol intake to no more than 1 drink a day for nonpregnant women and 2 drinks a day for men. One drink equals 12 oz of beer, 5 oz of wine, or 1 oz of hard liquor. General instructions  Make sure you get all the vaccines that your health care provider recommends, especially  pneumonia (pneumococcal) and flu (influenza) vaccines.  Do not use any products that contain nicotine or tobacco, such as cigarettes and e-cigarettes. If you need help quitting, ask your health care provider.  Exercise every day, or as often told by your health care provider.  Keep all follow-up visits as told by your health care provider. This is important. Contact a health care provider if:  You have diarrhea, cramps in your abdomen, and other GI problems that are present almost all the time.  Your symptoms do not improve with treatment.  You continue to lose weight.  You develop a rash or sores  on your skin.  You develop eye problems.  You have a fever.  Your symptoms get worse.  You develop new symptoms. Get help right away if:  You have bloody diarrhea.  You have severe pain in your abdomen.  You cannot pass stools. Summary  Crohn's disease affects each person differently. There are multiple treatment options that can help you manage the condition.  Talk with your health care provider or diet and nutrition specialist (registered dietitian) about what diet is best for you.  Make sure you get all the vaccines that your health care provider recommends, especially pneumonia (pneumococcal) and flu (influenza) vaccines. This information is not intended to replace advice given to you by your health care provider. Make sure you discuss any questions you have with your health care provider. Document Revised: 04/13/2017 Document Reviewed: 01/01/2017 Elsevier Patient Education  [place]  Crohn's disease is a long-lasting (chronic) disease that affects the gastrointestinal (GI) tract. Crohn's disease often causes irritation and inflammation in the small intestine and the beginning of the large intestine, but it can affect any part of the GI tract. Crohn's disease is part of a group of illnesses that are known as inflammatory bowel disease  (IBD). Crohn's disease may start slowly and get worse over time. Symptoms may come and go. They may also go away for months or even years at a time (remission). What are the causes? The exact cause of this condition is not known. It may involve a response that causes your body's disease-fighting (immune) system to attack healthy cells and tissues (autoimmune response). Bacteria, genes, and your environment may also play a role. What increases the risk? The following factors may make you more likely to develop this condition:  Having a family member who has Crohn's disease, another IBD, or an autoimmune condition.  Using products that contain nicotine or tobacco, such as cigarettes and e-cigarettes.  Being in your 22s.  Having [place] European ancestry. What are the signs or symptoms? The main symptoms of this condition involve your GI tract. These include:  Diarrhea.  Pain or cramping in the abdomen. This is commonly felt in the lower right side of the abdomen.  Frequent watery or bloody stools.  Constipation. This may mean having: ? Fewer bowel movements in a week than normal. ? Difficulty having a bowel movement. ? Stools that are dry, hard, or larger than normal.  Rectal bleeding.  Rectal pain.  An urgent need to have a bowel movement.  The feeling that you are not finished having a bowel movement. Other symptoms may include:  Unexplained weight loss.  Fatigue.  Fever.  Nausea.  Loss of appetite.  Joint pain.  Vision changes.  Red bumps or sores on the skin.  Sores inside the mouth. How is this diagnosed? This condition may be diagnosed based on:  Your symptoms and your medical history.  A physical exam.  Tests, which may include: ? Blood tests. ? Stool sample tests. ? Imaging tests, such as X-rays and CT scans. ? Tests to examine the inside of your intestines using a long, flexible tube that has a light and a camera on the end (endoscopy or  colonoscopy). ? A procedure to remove tissue samples from inside your bowel for testing (biopsy). You may need to work with a health care provider who specializes in diseases of the digestive tract (gastroenterologist). How is this treated? There is no cure for this condition, but treatment can help you  manage your symptoms. Crohn's disease affects each person differently. Your treatment may include:  Resting your bowels. This involves having a period of healing time when your bowels are not passing stools. This may be done by: ? Drinking only clear liquids. These are liquids that you can see through, such as water, black coffee, fruit juice without pulp, broth, gelatin, and ice pops. ? Getting nutrition through an IV for a period of time.  Medicines. These may be used by themselves or with other treatments (combination therapy). These may include antibiotic medicines. You may be given medicines that help to: ? Reduce inflammation. ? Control your immune system activity. ? Fight infections. ? Relieve cramps and prevent diarrhea. ? Control your pain.  Surgery. You may need surgery if: ? Medicines and other treatments are not working anymore. ? You develop complications from severe Crohn's disease. ? A section of your intestine becomes so damaged that it needs to be removed. Follow these instructions at home: Medicines  Take over-the-counter and prescription medicines only as told by your health care provider.  If you were prescribed an antibiotic, take it as told by your health care provider. Do not stop taking the antibiotic even if you start to feel better.  Avoid taking ibuprofen or other NSAID medicines if possible, these can make Crohn's disease worse. Eating and drinking  Talk with your health care provider or a diet and nutrition specialist (registered dietitian) about what diet is best for you.  Drink enough fluid to keep your urine pale yellow.  If you are taking steroids to  reduce inflammation, get plenty of calcium in your diet to help keep your bones healthy. You may also consider taking a calcium supplement with vitamin D.  Keep a food diary to identify foods that make your symptoms better or worse.  Avoid foods that cause symptoms.  Follow instructions from your health care provider about eating or drinking restrictions if you have worsening symptoms (flare-up).  Limit alcohol intake to no more than 1 drink a day for nonpregnant women and 2 drinks a day for men. One drink equals 12 oz of beer, 5 oz of wine, or 1 oz of hard liquor. General instructions  Make sure you get all the vaccines that your health care provider recommends, especially pneumonia (pneumococcal) and flu (influenza) vaccines.  Do not use any products that contain nicotine or tobacco, such as cigarettes and e-cigarettes. If you need help quitting, ask your health care provider.  Exercise every day, or as often told by your health care provider.  Keep all follow-up visits as told by your health care provider. This is important. Contact a health care provider if:  You have diarrhea, cramps in your abdomen, and other GI problems that are present almost all the time.  Your symptoms do not improve with treatment.  You continue to lose weight.  You develop a rash or sores on your skin.  You develop eye problems.  You have a fever.  Your symptoms get worse.  You develop new symptoms. Get help right away if:  You have bloody diarrhea.  You have severe pain in your abdomen.  You cannot pass stools. Summary  Crohn's disease affects each person differently. There are multiple treatment options that can help you manage the condition.  Talk with your health care provider or diet and nutrition specialist (registered dietitian) about what diet is best for you.  Make sure you get all the vaccines that your health care provider recommends,  especially pneumonia (pneumococcal) and flu  (influenza) vaccines. This information is not intended to replace advice given to you by your health care provider. Make sure you discuss any questions you have with your health care provider. Document Revised: 04/13/2017 Document Reviewed: 01/01/2017 Elsevier Patient Education  2020 [employer].

## 2020-02-20 NOTE — Progress Notes (Signed)
IVs removed from patient. Discharge instructions given to patient. Verbalized understanding. No acute distress at this time. Family member at bedside and will be transporting patient home,

## 2020-02-20 NOTE — Progress Notes (Signed)
Patient had no complaints of pain but was very bradycardic throughout the night.  Heart rate ranging in the 50's.  Will continue to monitor.  [name]

## 2020-02-26 ENCOUNTER — Telehealth: Payer: 59 | Admitting: Unknown Physician Specialty

## 2020-02-26 ENCOUNTER — Encounter: Admission: RE | Payer: Self-pay | Source: Home / Self Care

## 2020-02-26 ENCOUNTER — Ambulatory Visit: Admission: RE | Admit: 2020-02-26 | Payer: 59 | Source: Home / Self Care | Admitting: Urology

## 2020-02-26 SURGERY — LITHOTRIPSY, ESWL
Anesthesia: Moderate Sedation | Laterality: Left

## 2020-03-02 DIAGNOSIS — N200 Calculus of kidney: Secondary | ICD-10-CM | POA: Insufficient documentation

## 2020-03-08 ENCOUNTER — Inpatient Hospital Stay: Payer: 59 | Admitting: Nurse Practitioner

## 2020-03-10 ENCOUNTER — Ambulatory Visit (INDEPENDENT_AMBULATORY_CARE_PROVIDER_SITE_OTHER): Payer: 59 | Admitting: Surgery

## 2020-03-10 ENCOUNTER — Other Ambulatory Visit: Payer: Self-pay

## 2020-03-10 ENCOUNTER — Encounter: Payer: Self-pay | Admitting: Surgery

## 2020-03-10 VITALS — BP 120/77 | HR 64 | Temp 98.1°F | Ht 73.0 in | Wt 229.0 lb

## 2020-03-10 DIAGNOSIS — K529 Noninfective gastroenteritis and colitis, unspecified: Secondary | ICD-10-CM | POA: Diagnosis not present

## 2020-03-10 NOTE — Patient Instructions (Addendum)
Continue to advance your diet as tolerated. Keep a food diary so you know what foods affect you and how.  Follow up with Dr Marius Ditch as scheduled.   Follow-up with our office as needed.  Please call and ask to speak with a nurse if you develop questions or concerns.

## 2020-03-12 NOTE — Progress Notes (Signed)
Outpatient Surgical Follow Up  03/12/2020  Gordon Carson is an 39 y.o. male.   Chief Complaint  Patient presents with  . New Patient (Initial Visit)    crohns    HPI: Gordon Carson is a 39 year old male well-known to me with a recent hospitalization for microperforations related to Crohn's.  He completed antibiotic course.  He is doing very well.  No fevers no chills eating a regular diet and having normal bowel movements.  CT scan personally reviewed with significant improvement and no worsening of the ileitis.  The is about to see Dr. Marius Ditch next week.  Past Medical History:  Diagnosis Date  . Anxiety   . Hypertension   . Slipped epiphysis     Past Surgical History:  Procedure Laterality Date  . ANKLE FRACTURE SURGERY Right   . APPENDECTOMY  2004  . HIP SURGERY Left 1998   pin placed    Family History  Problem Relation Age of Onset  . Cancer Mother        breast  . Hypertension Maternal Grandfather   . Stroke Maternal Grandfather   . Heart disease Maternal Grandfather     Social History:  reports that he has never smoked. He has never used smokeless tobacco. He reports that he does not drink alcohol and does not use drugs.  Allergies:  Allergies  Allergen Reactions  . Codeine Nausea And Vomiting    Medications reviewed.    ROS Full ROS performed and is otherwise negative other than what is stated in HPI   BP 120/77   Pulse 64   Temp 98.1 F (36.7 C)   Ht 6\' 1"  (1.854 m)   Wt 229 lb (103.9 kg)   SpO2 97%   BMI 30.21 kg/m   Physical Exam Vitals and nursing note reviewed. Exam conducted with a chaperone present.  Constitutional:      General: He is not in acute distress.    Appearance: Normal appearance. He is normal weight.  Eyes:     General:        Right eye: No discharge.        Left eye: No discharge.  Cardiovascular:     Rate and Rhythm: Normal rate and regular rhythm.  Pulmonary:     Effort: Pulmonary effort is normal. No respiratory  distress.     Breath sounds: Normal breath sounds. No stridor. No wheezing or rhonchi.  Abdominal:     General: Abdomen is flat. There is no distension.     Palpations: Abdomen is soft. There is no mass.     Tenderness: There is no abdominal tenderness. There is no guarding or rebound.     Hernia: No hernia is present.  Musculoskeletal:        General: No swelling or tenderness. Normal range of motion.     Cervical back: Normal range of motion and neck supple. No rigidity or tenderness.  Lymphadenopathy:     Cervical: No cervical adenopathy.  Skin:    General: Skin is warm and dry.     Capillary Refill: Capillary refill takes less than 2 seconds.  Neurological:     General: No focal deficit present.     Mental Status: He is alert and oriented to person, place, and time.  Psychiatric:        Mood and Affect: Mood normal.        Behavior: Behavior normal.        Thought Content: Thought content normal.  Judgment: Judgment normal.        Assessment/Plan:  1. Inflammatory bowel disease 39 year old male with history of ileitis likely related to Crohn's disease and microperforation.  He completely recovered and is doing very well.  No need for surgical intervention at this time. We will see Dr. Marius Ditch shortly.  We will be available   Greater than 50% of the 25 minutes  visit was spent in counseling/coordination of care   Caroleen Hamman, MD [place]

## 2020-03-16 ENCOUNTER — Other Ambulatory Visit: Payer: Self-pay

## 2020-03-16 ENCOUNTER — Ambulatory Visit (INDEPENDENT_AMBULATORY_CARE_PROVIDER_SITE_OTHER): Payer: 59 | Admitting: Gastroenterology

## 2020-03-16 ENCOUNTER — Encounter: Payer: Self-pay | Admitting: Gastroenterology

## 2020-03-16 VITALS — BP 126/82 | HR 63 | Temp 98.2°F | Ht 73.0 in | Wt 229.1 lb

## 2020-03-16 DIAGNOSIS — K529 Noninfective gastroenteritis and colitis, unspecified: Secondary | ICD-10-CM | POA: Diagnosis not present

## 2020-03-16 MED ORDER — NA SULFATE-K SULFATE-MG SULF 17.5-3.13-1.6 GM/177ML PO SOLN: 354.0000 mL | Freq: Once | ORAL | 0 refills | Status: AC

## 2020-03-16 NOTE — Progress Notes (Signed)
Gordon Darby, MD [address]  [place]  [place], [place] [address]  Main: [phone]  Fax: [phone]    Gastroenterology Consultation  Referring Provider:     Valerie Roys, DO Primary Care Physician:  Gordon Crouch, MD Primary Gastroenterologist:  Dr. Cephas Carson Reason for Consultation:     Terminal ileitis, microperforation        HPI:   Gordon Carson is a 39 y.o. male referred by Dr. Idelle Crouch, MD  for consultation & management of recent admission for right lower quadrant pain, abdominal distention as well as diarrhea, found to have terminal ileal inflammation with microperforation.  Stool studies were positive for E. coli infection.  Patient has been managed conservatively with IV antibiotics followed by ciprofloxacin for 10 days duration.  Patient was evaluated by general surgery as inpatient, there was no indication for urgent surgical procedure.  Patient had a repeat CT scan 3 days after initial CT which showed resolution of free air.  Therefore, he was discharged home on antibiotics.  He had a follow-up with Dr. Dahlia Carson last week.  No indication for surgery at this time.  Patient is found to have elevated CRP, fecal calprotectin levels 170, HIV negative  Interval history Patient reports doing well since discharge.  He had 2 episodes of abdominal bloating and thought it might have been related to the food he ate.  He is very meticulous about the type of food he is eating which has been quite challenging.  He denies any diarrhea, pain, distention, fever, nausea, vomiting or rectal bleeding.  He does have flareup of knee pain.  Patient reports that he was going through significant stress for about a week prior to the hospitalization.  He was also taking NSAIDs daily for worsening of knee pain during that week.  Patient reports that he had intermittent GI symptoms for several years and thought it was secondary to lactose intolerance  Patient is  accompanied by his wife today.  He is an Arboriculturist by profession He does not smoke or drink alcohol  NSAIDs: As needed, regularly for knee pain  Antiplts/Anticoagulants/Anti thrombotics: None  GI Procedures: None No known family history of GI malignancy  Past Medical History:  Diagnosis Date  . Anxiety   . Hypertension   . Slipped epiphysis     Past Surgical History:  Procedure Laterality Date  . ANKLE FRACTURE SURGERY Right   . APPENDECTOMY  2004  . HIP SURGERY Left 1998   pin placed    Current Outpatient Medications:  .  amLODipine (NORVASC) 10 MG tablet, TAKE 1 TABLET(10 MG) BY MOUTH DAILY, Disp: 90 tablet, Rfl: 0 .  lisinopril (ZESTRIL) 5 MG tablet, TAKE 1 TABLET(5 MG) BY MOUTH DAILY, Disp: 90 tablet, Rfl: 0 .  Na Sulfate-K Sulfate-Mg Sulf 17.5-3.13-1.6 GM/177ML SOLN, Take 354 mLs by mouth once for 1 dose., Disp: 354 mL, Rfl: 0    Family History  Problem Relation Age of Onset  . Cancer Mother        breast  . Hypertension Maternal Grandfather   . Stroke Maternal Grandfather   . Heart disease Maternal Grandfather      Social History   Tobacco Use  . Smoking status: Never Smoker  . Smokeless tobacco: Never Used  Vaping Use  . Vaping Use: Never used  Substance Use Topics  . Alcohol use: No    Alcohol/week: 0.0 standard drinks  . Drug use: No    Allergies  as of 03/16/2020 - Review Complete 03/16/2020  Allergen Reaction Noted  . Codeine Nausea And Vomiting 10/13/2014    Review of Systems:    All systems reviewed and negative except where noted in HPI.   Physical Exam:  BP 126/82 (BP Location: Left Arm, Patient Position: Sitting, Cuff Size: Normal)   Pulse 63   Temp 98.2 F (36.8 C) (Oral)   Ht 6\' 1"  (1.854 m)   Wt 229 lb 2 oz (103.9 kg)   BMI 30.23 kg/m  No LMP for male patient.  General:   Alert,  Well-developed, well-nourished, pleasant and cooperative in NAD Head:  Normocephalic and atraumatic. Eyes:  Sclera clear, no icterus.   Conjunctiva  pink. Ears:  Normal auditory acuity. Nose:  No deformity, discharge, or lesions. Mouth:  No deformity or lesions,oropharynx pink & moist. Neck:  Supple; no masses or thyromegaly. Lungs:  Respirations even and unlabored.  Clear throughout to auscultation.   No wheezes, crackles, or rhonchi. No acute distress. Heart:  Regular rate and rhythm; no murmurs, clicks, rubs, or gallops. Abdomen:  Normal bowel sounds. Soft, non-tender and non-distended without masses, hepatosplenomegaly or hernias noted.  No guarding or rebound tenderness.   Rectal: Not performed Msk:  Symmetrical without gross deformities. Good, equal movement & strength bilaterally. Pulses:  Normal pulses noted. Extremities:  No clubbing or edema.  No cyanosis. Neurologic:  Alert and oriented x3;  grossly normal neurologically. Skin:  Intact without significant lesions or rashes. No jaundice. Lymph Nodes:  No significant cervical adenopathy. Psych:  Alert and cooperative. Normal mood and affect.  Imaging Studies: Reviewed  Assessment and Plan:   Gordon Carson is a 39 y.o. pleasant Caucasian male with history of hypertension, history of nephrolithiasis, large joint arthralgias with recent hospitalization for right lower quadrant pain, distention secondary to inflammation of the terminal ileum and cecum with microperforation and small amount of free fluid, s/p resolution of free air and fluid on repeat CT scan.  Patient is currently asymptomatic.  Elevated CRP and fecal calprotectin levels, history of intermittent heavy NSAID use for large joint arthralgias Patient finished 10 days course of ciprofloxacin, stool studies were positive for E. Coli  Recommend colonoscopy with TI evaluation and biopsies Differentials include Crohn's disease or NSAID induced enteritis or less likely viral etiology or TB enteritis Further recommendations after colonoscopy   Follow up in 6 weeks virtual visit   Gordon Darby, MD

## 2020-03-18 ENCOUNTER — Encounter: Payer: 59 | Admitting: Family Medicine

## 2020-03-25 ENCOUNTER — Other Ambulatory Visit
Admission: RE | Admit: 2020-03-25 | Discharge: 2020-03-25 | Disposition: A | Discharge status: Home / Self Care | Payer: 59 | Source: Ambulatory Visit | Attending: Gastroenterology | Admitting: Gastroenterology

## 2020-03-25 ENCOUNTER — Other Ambulatory Visit: Payer: Self-pay

## 2020-03-25 DIAGNOSIS — Z01812 Encounter for preprocedural laboratory examination: Secondary | ICD-10-CM | POA: Insufficient documentation

## 2020-03-25 DIAGNOSIS — Z20822 Contact with and (suspected) exposure to covid-19: Secondary | ICD-10-CM | POA: Insufficient documentation

## 2020-03-25 LAB — SARS CORONAVIRUS 2 (TAT 6-24 HRS): SARS Coronavirus 2: NEGATIVE

## 2020-03-26 ENCOUNTER — Encounter: Payer: 59 | Admitting: Family Medicine

## 2020-03-29 ENCOUNTER — Encounter
Admission: RE | Disposition: A | Discharge status: Home / Self Care | Payer: Self-pay | Source: Home / Self Care | Attending: Gastroenterology

## 2020-03-29 ENCOUNTER — Ambulatory Visit: Payer: 59 | Admitting: Certified Registered Nurse Anesthetist

## 2020-03-29 ENCOUNTER — Encounter: Payer: Self-pay | Admitting: Gastroenterology

## 2020-03-29 ENCOUNTER — Ambulatory Visit
Admission: RE | Admit: 2020-03-29 | Discharge: 2020-03-29 | Disposition: A | Discharge status: Home / Self Care | Payer: 59 | Attending: Gastroenterology | Admitting: Gastroenterology

## 2020-03-29 DIAGNOSIS — Z803 Family history of malignant neoplasm of breast: Secondary | ICD-10-CM | POA: Diagnosis not present

## 2020-03-29 DIAGNOSIS — D124 Benign neoplasm of descending colon: Secondary | ICD-10-CM | POA: Insufficient documentation

## 2020-03-29 DIAGNOSIS — R933 Abnormal findings on diagnostic imaging of other parts of digestive tract: Secondary | ICD-10-CM | POA: Insufficient documentation

## 2020-03-29 DIAGNOSIS — Z885 Allergy status to narcotic agent status: Secondary | ICD-10-CM | POA: Diagnosis not present

## 2020-03-29 DIAGNOSIS — Z823 Family history of stroke: Secondary | ICD-10-CM | POA: Diagnosis not present

## 2020-03-29 DIAGNOSIS — Z8249 Family history of ischemic heart disease and other diseases of the circulatory system: Secondary | ICD-10-CM | POA: Insufficient documentation

## 2020-03-29 DIAGNOSIS — I10 Essential (primary) hypertension: Secondary | ICD-10-CM | POA: Insufficient documentation

## 2020-03-29 DIAGNOSIS — K529 Noninfective gastroenteritis and colitis, unspecified: Secondary | ICD-10-CM

## 2020-03-29 DIAGNOSIS — Z79899 Other long term (current) drug therapy: Secondary | ICD-10-CM | POA: Insufficient documentation

## 2020-03-29 HISTORY — PX: COLONOSCOPY WITH PROPOFOL: SHX5780

## 2020-03-29 SURGERY — COLONOSCOPY WITH PROPOFOL
Anesthesia: General

## 2020-03-29 MED ORDER — PROPOFOL 10 MG/ML IV BOLUS
INTRAVENOUS | Status: DC | PRN
  Administered 2020-03-29: 50 mg via INTRAVENOUS
  Administered 2020-03-29: 30 mg via INTRAVENOUS
  Administered 2020-03-29: 50 mg via INTRAVENOUS

## 2020-03-29 MED ORDER — SODIUM CHLORIDE 0.9 % IV SOLN: INTRAVENOUS | Status: DC

## 2020-03-29 MED ORDER — PROPOFOL 500 MG/50ML IV EMUL
INTRAVENOUS | Status: DC | PRN
  Administered 2020-03-29: 200 ug/kg/min via INTRAVENOUS

## 2020-03-29 MED ORDER — LIDOCAINE HCL (CARDIAC) PF 100 MG/5ML IV SOSY
PREFILLED_SYRINGE | INTRAVENOUS | Status: DC | PRN
  Administered 2020-03-29: 100 mg via INTRAVENOUS

## 2020-03-29 MED ORDER — PHENYLEPHRINE HCL (PRESSORS) 10 MG/ML IV SOLN
INTRAVENOUS | Status: DC | PRN
  Administered 2020-03-29 (×3): 50 ug via INTRAVENOUS
  Administered 2020-03-29: 100 ug via INTRAVENOUS

## 2020-03-29 NOTE — Anesthesia Procedure Notes (Signed)
Procedure Name: MAC Date/Time: 03/29/2020 10:35 AM Performed by: Lily Peer, Sevag Shearn, CRNA Pre-anesthesia Checklist: Patient identified, Emergency Drugs available, Suction available and Patient being monitored Patient Re-evaluated:Patient Re-evaluated prior to induction Oxygen Delivery Method: Nasal cannula

## 2020-03-29 NOTE — Anesthesia Preprocedure Evaluation (Addendum)
Anesthesia Evaluation  Patient identified by MRN, date of birth, ID band Patient awake    Reviewed: Allergy & Precautions, H&P , NPO status , Patient's Chart, lab work & pertinent test results  History of Anesthesia Complications Negative for: history of anesthetic complications  Airway Mallampati: II  TM Distance: >3 FB Neck ROM: full    Dental  (+) Teeth Intact   Pulmonary neg pulmonary ROS, neg sleep apnea, neg COPD,    breath sounds clear to auscultation       Cardiovascular hypertension, (-) angina(-) Past MI and (-) Cardiac Stents (-) dysrhythmias  Rhythm:regular Rate:Normal     Neuro/Psych PSYCHIATRIC DISORDERS Anxiety negative neurological ROS     GI/Hepatic negative GI ROS, Neg liver ROS,   Endo/Other  negative endocrine ROS  Renal/GU Renal disease  negative genitourinary   Musculoskeletal   Abdominal   Peds  Hematology negative hematology ROS (+)   Anesthesia Other Findings Past Medical History: No date: Anxiety No date: Hypertension No date: Slipped epiphysis  Past Surgical History: No date: ANKLE FRACTURE SURGERY; Right 2004: APPENDECTOMY 1998: HIP SURGERY; Left     Comment:  pin placed  BMI    Body Mass Index: 29.03 kg/m      Reproductive/Obstetrics negative OB ROS                            Anesthesia Physical Anesthesia Plan  ASA: II  Anesthesia Plan: General   Post-op Pain Management:    Induction:   PONV Risk Score and Plan: Propofol infusion and TIVA  Airway Management Planned: Nasal Cannula  Additional Equipment:   Intra-op Plan:   Post-operative Plan:   Informed Consent: I have reviewed the patients History and Physical, chart, labs and discussed the procedure including the risks, benefits and alternatives for the proposed anesthesia with the patient or authorized representative who has indicated his/her understanding and acceptance.      Dental Advisory Given  Plan Discussed with: Anesthesiologist, CRNA and Surgeon  Anesthesia Plan Comments:         Anesthesia Quick Evaluation

## 2020-03-29 NOTE — Anesthesia Postprocedure Evaluation (Signed)
Anesthesia Post Note  Patient: Gordon Carson  Procedure(s) Performed: COLONOSCOPY WITH PROPOFOL (N/A )  Patient location during evaluation: PACU Anesthesia Type: General Level of consciousness: awake and alert Pain management: pain level controlled Vital Signs Assessment: post-procedure vital signs reviewed and stable Respiratory status: spontaneous breathing, nonlabored ventilation and respiratory function stable Cardiovascular status: blood pressure returned to baseline and stable Postop Assessment: no apparent nausea or vomiting Anesthetic complications: no   No complications documented.   Last Vitals:  Vitals:   03/29/20 1115 03/29/20 1125  BP: 107/69 114/87  Pulse: 65 70  Resp: 12 20  Temp:    SpO2: 95% 93%    Last Pain:  Vitals:   03/29/20 1125  TempSrc:   PainSc: 0-No pain                 Brett Canales Tyler Cubit

## 2020-03-29 NOTE — Op Note (Signed)
[facility] Gastroenterology Patient Name: Gordon Carson Procedure Date: 03/29/2020 10:32 AM MRN: [id number] Account #: [id number] Date of Birth: 11-23-1980 Admit Type: Outpatient Age: 39 Room: [facility] ENDO ROOM 1 Gender: Male Note Status: Finalized Procedure:             Colonoscopy Indications:           Suspected Crohn's disease of the small bowel, Abnormal                         CT of the GI tract Providers:             Lin Landsman MD, MD Referring MD:          Leonie Douglas. Doy Hutching, MD (Referring MD) Medicines:             General Anesthesia Complications:         No immediate complications. Estimated blood loss: None. Procedure:             Pre-Anesthesia Assessment:                        - Prior to the procedure, a History and Physical was                         performed, and patient medications and allergies were                         reviewed. The patient is competent. The risks and                         benefits of the procedure and the sedation options and                         risks were discussed with the patient. All questions                         were answered and informed consent was obtained.                         Patient identification and proposed procedure were                         verified by the physician, the nurse, the                         anesthesiologist, the anesthetist and the technician                         in the pre-procedure area in the procedure room in the                         endoscopy suite. Mental Status Examination: alert and                         oriented. Airway Examination: normal oropharyngeal                         airway and neck mobility. Respiratory Examination:  clear to auscultation. CV Examination: normal.                         Prophylactic Antibiotics: The patient does not require                         prophylactic antibiotics. Prior Anticoagulants: The                          patient has taken no previous anticoagulant or                         antiplatelet agents. ASA Grade Assessment: II - A                         patient with mild systemic disease. After reviewing                         the risks and benefits, the patient was deemed in                         satisfactory condition to undergo the procedure. The                         anesthesia plan was to use general anesthesia.                         Immediately prior to administration of medications,                         the patient was re-assessed for adequacy to receive                         sedatives. The heart rate, respiratory rate, oxygen                         saturations, blood pressure, adequacy of pulmonary                         ventilation, and response to care were monitored                         throughout the procedure. The physical status of the                         patient was re-assessed after the procedure.                        After obtaining informed consent, the colonoscope was                         passed under direct vision. Throughout the procedure,                         the patient's blood pressure, pulse, and oxygen                         saturations were monitored continuously. The  Colonoscope was introduced through the anus and                         advanced to the 10 cm into the ileum. The colonoscopy                         was performed with moderate difficulty due to                         significant looping. Successful completion of the                         procedure was aided by changing the patient to a                         supine position and applying abdominal pressure. The                         patient tolerated the procedure well. The quality of                         the bowel preparation was evaluated using the BBPS                         [facility] Bowel Preparation Scale) with scores  of: Right                         Colon = 3, Transverse Colon = 3 and Left Colon = 3                         (entire mucosa seen well with no residual staining,                         small fragments of stool or opaque liquid). The total                         BBPS score equals 9. Findings:      The perianal and digital rectal examinations were normal. Pertinent       negatives include normal sphincter tone and no palpable rectal lesions.      The terminal ileum appeared normal. Biopsies were taken with a cold       forceps for histology.      Two sessile polyps were found in the descending colon. The polyps were       diminutive in size. These polyps were removed with a cold biopsy       forceps. Resection and retrieval were complete.      Normal mucosa was found in the left colon and in the right colon.       Biopsies were taken with a cold forceps for histology.      The retroflexed view of the distal rectum and anal verge was normal and       showed no anal or rectal abnormalities. Impression:            - The examined portion of the ileum was normal.  Biopsied.                        - Two diminutive polyps in the descending colon,                         removed with a cold biopsy forceps. Resected and                         retrieved.                        - Normal mucosa in the left colon and in the right                         colon. Biopsied.                        - The distal rectum and anal verge are normal on                         retroflexion view. Recommendation:        - Discharge patient to home (with escort).                        - Resume previous diet today.                        - Continue present medications.                        - Await pathology results. Procedure Code(s):     --- Professional ---                        [phone], Colonoscopy, flexible; with biopsy, single or                         multiple Diagnosis Code(s):      --- Professional ---                        K63.5, Polyp of colon                        R93.3, Abnormal findings on diagnostic imaging of                         other parts of digestive tract CPT copyright 2019 American Medical Association. All rights reserved. The codes documented in this report are preliminary and upon coder review may  be revised to meet current compliance requirements. Dr. Ulyess Mort Lin Landsman MD, MD 03/29/2020 11:02:58 AM This report has been signed electronically. Number of Addenda: 0 Note Initiated On: 03/29/2020 10:32 AM Scope Withdrawal Time: 0 hours 13 minutes 2 seconds  Total Procedure Duration: 0 hours 21 minutes 21 seconds  Estimated Blood Loss:  Estimated blood loss: none.      [facility]

## 2020-03-29 NOTE — Transfer of Care (Signed)
Immediate Anesthesia Transfer of Care Note  Patient: Gordon Carson  Procedure(s) Performed: COLONOSCOPY WITH PROPOFOL (N/A )  Patient Location: PACU and Endoscopy Unit  Anesthesia Type:General  Level of Consciousness: drowsy  Airway & Oxygen Therapy: Patient Spontanous Breathing  Post-op Assessment: Report given to RN and Post -op Vital signs reviewed and stable  Post vital signs: Reviewed and stable  Last Vitals:  Vitals Value Taken Time  BP 114/83 03/29/20 1105  Temp 36.4 C 03/29/20 1105  Pulse 71 03/29/20 1106  Resp 13 03/29/20 1106  SpO2 93 % 03/29/20 1106  Vitals shown include unvalidated device data.  Last Pain:  Vitals:   03/29/20 1105  TempSrc:   PainSc: Asleep         Complications: No complications documented.

## 2020-03-29 NOTE — H&P (Signed)
Gordon Darby, MD [address]  [place]  [place], [place] [address]  Main: [phone]  Fax: [phone] Pager: [phone]  Primary Care Physician:  Idelle Crouch, MD Primary Gastroenterologist:  Dr. Cephas Carson  Pre-Procedure History & Physical: HPI:  Gordon Carson is a 39 y.o. male is here for an colonoscopy.   Past Medical History:  Diagnosis Date  . Anxiety   . Hypertension   . Slipped epiphysis     Past Surgical History:  Procedure Laterality Date  . ANKLE FRACTURE SURGERY Right   . APPENDECTOMY  2004  . HIP SURGERY Left 1998   pin placed    Prior to Admission medications   Medication Sig Start Date End Date Taking? Authorizing Provider  amLODipine (NORVASC) 10 MG tablet TAKE 1 TABLET(10 MG) BY MOUTH DAILY 02/19/20   Johnson, Megan P, DO  lisinopril (ZESTRIL) 5 MG tablet TAKE 1 TABLET(5 MG) BY MOUTH DAILY 02/18/20   Park Liter P, DO    Allergies as of 03/16/2020 - Review Complete 03/16/2020  Allergen Reaction Noted  . Codeine Nausea And Vomiting 10/13/2014    Family History  Problem Relation Age of Onset  . Cancer Mother        breast  . Hypertension Maternal Grandfather   . Stroke Maternal Grandfather   . Heart disease Maternal Grandfather     Social History   Socioeconomic History  . Marital status: Married    Spouse name: Not on file  . Number of children: Not on file  . Years of education: Not on file  . Highest education level: Not on file  Occupational History  . Not on file  Tobacco Use  . Smoking status: Never Smoker  . Smokeless tobacco: Never Used  Vaping Use  . Vaping Use: Never used  Substance and Sexual Activity  . Alcohol use: No    Alcohol/week: 0.0 standard drinks  . Drug use: No  . Sexual activity: Yes  Other Topics Concern  . Not on file  Social History Narrative  . Not on file   Social Determinants of Health   Financial Resource Strain:   . Difficulty of Paying Living Expenses: Not on file   Food Insecurity:   . Worried About Charity fundraiser in the Last Year: Not on file  . Ran Out of Food in the Last Year: Not on file  Transportation Needs:   . Lack of Transportation (Medical): Not on file  . Lack of Transportation (Non-Medical): Not on file  Physical Activity:   . Days of Exercise per Week: Not on file  . Minutes of Exercise per Session: Not on file  Stress:   . Feeling of Stress : Not on file  Social Connections:   . Frequency of Communication with Friends and Family: Not on file  . Frequency of Social Gatherings with Friends and Family: Not on file  . Attends Religious Services: Not on file  . Active Member of Clubs or Organizations: Not on file  . Attends Archivist Meetings: Not on file  . Marital Status: Not on file  Intimate Partner Violence:   . Fear of Current or Ex-Partner: Not on file  . Emotionally Abused: Not on file  . Physically Abused: Not on file  . Sexually Abused: Not on file    Review of Systems: See HPI, otherwise negative ROS  Physical Exam: BP 133/83   Pulse 69   Temp 97.9 F (36.6 C) (Temporal)  Resp 18   Ht 6\' 1"  (1.854 m)   Wt 99.8 kg   SpO2 100%   BMI 29.03 kg/m  General:   Alert,  pleasant and cooperative in NAD Head:  Normocephalic and atraumatic. Neck:  Supple; no masses or thyromegaly. Lungs:  Clear throughout to auscultation.    Heart:  Regular rate and rhythm. Abdomen:  Soft, nontender and nondistended. Normal bowel sounds, without guarding, and without rebound.   Neurologic:  Alert and  oriented x4;  grossly normal neurologically.  Impression/Plan: Gordon Carson is here for an colonoscopy to be performed for terminal ileitis  Risks, benefits, limitations, and alternatives regarding  colonoscopy have been reviewed with the patient.  Questions have been answered.  All parties agreeable.   Sherri Sear, MD  03/29/2020, 10:12 AM

## 2020-03-30 LAB — SURGICAL PATHOLOGY

## 2020-04-27 ENCOUNTER — Encounter: Payer: Self-pay | Admitting: Gastroenterology

## 2020-04-27 ENCOUNTER — Ambulatory Visit (INDEPENDENT_AMBULATORY_CARE_PROVIDER_SITE_OTHER): Payer: 59 | Admitting: Gastroenterology

## 2020-04-27 VITALS — BP 143/86 | HR 82 | Temp 97.7°F | Ht 73.0 in | Wt 235.0 lb

## 2020-04-27 DIAGNOSIS — K219 Gastro-esophageal reflux disease without esophagitis: Secondary | ICD-10-CM | POA: Diagnosis not present

## 2020-04-27 DIAGNOSIS — K529 Noninfective gastroenteritis and colitis, unspecified: Secondary | ICD-10-CM

## 2020-04-27 NOTE — Progress Notes (Signed)
Gordon Darby, MD [address]  [place]  [place], [place] [address]  Main: [phone]  Fax: [phone]    Gastroenterology Consultation  Referring Provider:     Idelle Crouch, MD Primary Care Physician:  Gordon Crouch, MD Primary Gastroenterologist:  Dr. Cephas Carson Reason for Consultation:     Terminal ileitis, microperforation        HPI:   Gordon Carson is a 39 y.o. male referred by Dr. Idelle Crouch, MD  for consultation & management of recent admission for right lower quadrant pain, abdominal distention as well as diarrhea, found to have terminal ileal inflammation with microperforation.  Stool studies were positive for E. coli infection.  Patient has been managed conservatively with IV antibiotics followed by ciprofloxacin for 10 days duration.  Patient was evaluated by general surgery as inpatient, there was no indication for urgent surgical procedure.  Patient had a repeat CT scan 3 days after initial CT which showed resolution of free air.  Therefore, he was discharged home on antibiotics.  He had a follow-up with Dr. Dahlia Carson last week.  No indication for surgery at this time.  Patient is found to have elevated CRP, fecal calprotectin levels 170, HIV negative  Interval history Patient reports doing well since discharge.  He had 2 episodes of abdominal bloating and thought it might have been related to the food he ate.  He is very meticulous about the type of food he is eating which has been quite challenging.  He denies any diarrhea, pain, distention, fever, nausea, vomiting or rectal bleeding.  He does have flareup of knee pain.  Patient reports that he was going through significant stress for about a week prior to the hospitalization.  He was also taking NSAIDs daily for worsening of knee pain during that week.  Patient reports that he had intermittent GI symptoms for several years and thought it was secondary to lactose intolerance  Follow-up visit  04/27/2020 Patient underwent colonoscopy with TI evaluation it was completely unremarkable including biopsies.  There was no evidence of any Crohn's disease endoscopically as well as histologically.  Today, patient reports that he had an episode of possibly stomach bug about 2 weeks ago that resulted in severe diarrhea, he said he may have contracted it from his kid's school.  He underwent H. pylori IgG, CBC and BMP which were normal.  He does report nocturnal heartburn for which he takes Pepcid AC and provides relief.  He thinks is most likely related to his lifestyle.  He does report intermittent abdominal bloating.  He gained 2 to 3 pounds since last visit.  Patient is accompanied by his wife today.  He is an Arboriculturist by profession He does not smoke or drink alcohol  NSAIDs: As needed, regularly for knee pain  Antiplts/Anticoagulants/Anti thrombotics: None  GI Procedures:  Colonoscopy 03/29/2020  - The examined portion of the ileum was normal. Biopsied. - Two diminutive polyps in the descending colon, removed with a cold biopsy forceps. Resected and retrieved. - Normal mucosa in the left colon and in the right colon. Biopsied. - The distal rectum and anal verge are normal on retroflexion view.  DIAGNOSIS:  A. TERMINAL ILEUM; COLD BIOPSY:  - BENIGN ENTERIC MUCOSA WITH NORMAL VILLOUS ARCHITECTURE AND MILD  REACTIVE FOLLICULAR LYMPHOID HYPERPLASIA.  - NEGATIVE FOR ACTIVE MUCOSAL ILEITIS.  - NEGATIVE FOR DYSPLASIA AND MALIGNANCY.   B. COLON, RIGHT; COLD BIOPSY:  - BENIGN COLONIC MUCOSA WITH NO SIGNIFICANT  HISTOPATHOLOGIC CHANGE.  - NEGATIVE FOR ACTIVE MUCOSAL COLITIS AND FEATURES OF CHRONICITY.  - NEGATIVE FOR DYSPLASIA AND MALIGNANCY.   C. COLON, LEFT; COLD BIOPSY:  - BENIGN COLONIC MUCOSA WITH NO SIGNIFICANT HISTOPATHOLOGIC CHANGE.  - NEGATIVE FOR ACTIVE MUCOSAL COLITIS AND FEATURES OF CHRONICITY.  - NEGATIVE FOR DYSPLASIA AND MALIGNANCY.   D. COLON POLYPS X2, DESCENDING; COLD  BIOPSY:  - FRAGMENTS (X2) OF TUBULAR ADENOMAS.  - NEGATIVE FOR HIGH-GRADE DYSPLASIA AND MALIGNANCY.  No known family history of GI malignancy  Past Medical History:  Diagnosis Date  . Anxiety   . Hypertension   . Slipped epiphysis     Past Surgical History:  Procedure Laterality Date  . ANKLE FRACTURE SURGERY Right   . APPENDECTOMY  2004  . COLONOSCOPY WITH PROPOFOL N/A 03/29/2020   Procedure: COLONOSCOPY WITH PROPOFOL;  Surgeon: Gordon Landsman, MD;  Location: [facility] ENDOSCOPY;  Service: Gastroenterology;  Laterality: N/A;  . HIP SURGERY Left 1998   pin placed    Current Outpatient Medications:  .  amLODipine (NORVASC) 10 MG tablet, TAKE 1 TABLET(10 MG) BY MOUTH DAILY, Disp: 90 tablet, Rfl: 0 .  lisinopril (ZESTRIL) 5 MG tablet, TAKE 1 TABLET(5 MG) BY MOUTH DAILY, Disp: 90 tablet, Rfl: 0    Family History  Problem Relation Age of Onset  . Cancer Mother        breast  . Hypertension Maternal Grandfather   . Stroke Maternal Grandfather   . Heart disease Maternal Grandfather      Social History   Tobacco Use  . Smoking status: Never Smoker  . Smokeless tobacco: Never Used  Vaping Use  . Vaping Use: Never used  Substance Use Topics  . Alcohol use: No    Alcohol/week: 0.0 standard drinks  . Drug use: No    Allergies as of 04/27/2020 - Review Complete 03/29/2020  Allergen Reaction Noted  . Codeine Nausea And Vomiting 10/13/2014    Review of Systems:    All systems reviewed and negative except where noted in HPI.   Physical Exam:  BP (!) 143/86 (BP Location: Left Arm, Patient Position: Sitting, Cuff Size: Normal)   Pulse 82   Temp 97.7 F (36.5 C) (Oral)   Ht 6\' 1"  (1.854 m)   Wt 235 lb (106.6 kg)   BMI 31.00 kg/m  No LMP for male patient.  General:   Alert,  Well-developed, well-nourished, pleasant and cooperative in NAD Head:  Normocephalic and atraumatic. Eyes:  Sclera clear, no icterus.   Conjunctiva pink. Ears:  Normal auditory acuity. Nose:   No deformity, discharge, or lesions. Mouth:  No deformity or lesions,oropharynx pink & moist. Neck:  Supple; no masses or thyromegaly. Lungs:  Respirations even and unlabored.  Clear throughout to auscultation.   No wheezes, crackles, or rhonchi. No acute distress. Heart:  Regular rate and rhythm; no murmurs, clicks, rubs, or gallops. Abdomen:  Normal bowel sounds. Soft, non-tender and non-distended without masses, hepatosplenomegaly or hernias noted.  No guarding or rebound tenderness.   Rectal: Not performed Msk:  Symmetrical without gross deformities. Good, equal movement & strength bilaterally. Pulses:  Normal pulses noted. Extremities:  No clubbing or edema.  No cyanosis. Neurologic:  Alert and oriented x3;  grossly normal neurologically. Skin:  Intact without significant lesions or rashes. No jaundice. Lymph Nodes:  No significant cervical adenopathy. Psych:  Alert and cooperative. Normal mood and affect.  Imaging Studies: Reviewed  Assessment and Plan:   TYMEER VAQUERA is a 39 y.o. pleasant  Caucasian male with history of hypertension, history of nephrolithiasis, large joint arthralgias with recent hospitalization for right lower quadrant pain, distention secondary to inflammation of the terminal ileum and cecum with microperforation and small amount of free fluid, s/p resolution of free air and fluid on repeat CT scan.  Patient is currently asymptomatic.  Elevated CRP and fecal calprotectin levels, history of intermittent heavy NSAID use for large joint arthralgias Patient finished 10 days course of ciprofloxacin, stool studies were positive for E. Coli colonoscopy with TI evaluation and biopsies were unremarkable Recheck fecal calprotectin levels, if elevated, recommend video capsule endoscopy  Chronic nocturnal heartburn Discussed about antireflux lifestyle Continue Pepcid for now.  If symptoms are persistent, recommend upper endoscopy  Follow up based on the above  work-up   Gordon Darby, MD

## 2020-04-27 NOTE — Patient Instructions (Signed)

## 2020-05-13 ENCOUNTER — Telehealth: Payer: Self-pay | Admitting: Gastroenterology

## 2020-05-13 NOTE — Telephone Encounter (Signed)
Patient states his acid reflux symptoms do not seem to be improving and he wants to know if Dr. Allegra Lai wanted him to go ahead and scheduled the capsule endo, or what his next steps should be.

## 2020-05-17 ENCOUNTER — Other Ambulatory Visit: Payer: Self-pay

## 2020-05-17 ENCOUNTER — Other Ambulatory Visit: Payer: Self-pay | Admitting: Family Medicine

## 2020-05-17 DIAGNOSIS — K219 Gastro-esophageal reflux disease without esophagitis: Secondary | ICD-10-CM

## 2020-05-17 MED ORDER — OMEPRAZOLE 20 MG PO CPDR: 20.0000 mg | DELAYED_RELEASE_CAPSULE | Freq: Two times a day (BID) | ORAL | 1 refills | Status: AC

## 2020-05-17 NOTE — Telephone Encounter (Signed)
Patient verbalized understanding. Scheduled his EGD on 05/31/2020 went over instructions with patient and mailed them and sent to [facility]. Sent medication to the pharmacy for omeprazole

## 2020-05-17 NOTE — Addendum Note (Signed)
Addended by: Radene Knee L on: 05/17/2020 02:51 PM   Modules accepted: Orders

## 2020-05-17 NOTE — Telephone Encounter (Signed)
Let's switch to omeprazole 20 mg twice daily before meals and schedule upper endoscopy for acid reflux  RV

## 2020-05-17 NOTE — Telephone Encounter (Signed)
Called patient he states he is bringing is calprotectin fecal test back today. Please advised next steps

## 2020-05-18 LAB — CALPROTECTIN, FECAL: Calprotectin, Fecal: <16 ug/g (ref 0–120)

## 2020-05-18 NOTE — Telephone Encounter (Signed)
Moved patient to 05/26/2020. Called trish and moved patient

## 2020-05-18 NOTE — Telephone Encounter (Signed)
Patient called and states the egd on 1.17.22 does not work for his wife to bring him and wants to know if he can change it to next Wed 1.12.22.

## 2020-05-24 ENCOUNTER — Other Ambulatory Visit
Admission: RE | Admit: 2020-05-24 | Discharge: 2020-05-24 | Disposition: A | Discharge status: Home / Self Care | Payer: 59 | Source: Ambulatory Visit | Attending: Gastroenterology | Admitting: Gastroenterology

## 2020-05-24 ENCOUNTER — Other Ambulatory Visit: Payer: Self-pay

## 2020-05-24 DIAGNOSIS — Z20822 Contact with and (suspected) exposure to covid-19: Secondary | ICD-10-CM | POA: Diagnosis not present

## 2020-05-24 DIAGNOSIS — Z01812 Encounter for preprocedural laboratory examination: Secondary | ICD-10-CM | POA: Diagnosis present

## 2020-05-24 LAB — SARS CORONAVIRUS 2 (TAT 6-24 HRS): SARS Coronavirus 2: NEGATIVE

## 2020-05-25 ENCOUNTER — Encounter: Payer: Self-pay | Admitting: Gastroenterology

## 2020-05-26 ENCOUNTER — Encounter: Payer: Self-pay | Admitting: Gastroenterology

## 2020-05-26 ENCOUNTER — Ambulatory Visit
Admission: RE | Admit: 2020-05-26 | Discharge: 2020-05-26 | Disposition: A | Discharge status: Home / Self Care | Payer: 59 | Attending: Gastroenterology | Admitting: Gastroenterology

## 2020-05-26 ENCOUNTER — Ambulatory Visit: Payer: 59 | Admitting: Anesthesiology

## 2020-05-26 ENCOUNTER — Encounter
Admission: RE | Disposition: A | Discharge status: Home / Self Care | Payer: Self-pay | Source: Home / Self Care | Attending: Gastroenterology

## 2020-05-26 ENCOUNTER — Other Ambulatory Visit: Payer: Self-pay

## 2020-05-26 DIAGNOSIS — Z885 Allergy status to narcotic agent status: Secondary | ICD-10-CM | POA: Diagnosis not present

## 2020-05-26 DIAGNOSIS — R1010 Upper abdominal pain, unspecified: Secondary | ICD-10-CM | POA: Insufficient documentation

## 2020-05-26 DIAGNOSIS — R12 Heartburn: Secondary | ICD-10-CM | POA: Insufficient documentation

## 2020-05-26 DIAGNOSIS — Z79899 Other long term (current) drug therapy: Secondary | ICD-10-CM | POA: Insufficient documentation

## 2020-05-26 DIAGNOSIS — K219 Gastro-esophageal reflux disease without esophagitis: Secondary | ICD-10-CM

## 2020-05-26 HISTORY — PX: ESOPHAGOGASTRODUODENOSCOPY (EGD) WITH PROPOFOL: SHX5813

## 2020-05-26 SURGERY — ESOPHAGOGASTRODUODENOSCOPY (EGD) WITH PROPOFOL
Anesthesia: General

## 2020-05-26 MED ORDER — GLYCOPYRROLATE 0.2 MG/ML IJ SOLN
INTRAMUSCULAR | Status: DC | PRN
  Administered 2020-05-26: .2 mg via INTRAVENOUS

## 2020-05-26 MED ORDER — PROPOFOL 10 MG/ML IV BOLUS
INTRAVENOUS | Status: DC | PRN
  Administered 2020-05-26: 100 mg via INTRAVENOUS

## 2020-05-26 MED ORDER — DEXMEDETOMIDINE (PRECEDEX) IN NS 20 MCG/5ML (4 MCG/ML) IV SYRINGE
PREFILLED_SYRINGE | INTRAVENOUS | Status: DC | PRN
  Administered 2020-05-26: 20 ug via INTRAVENOUS

## 2020-05-26 MED ORDER — SODIUM CHLORIDE 0.9 % IV SOLN: INTRAVENOUS | Status: DC

## 2020-05-26 MED ORDER — LIDOCAINE HCL (CARDIAC) PF 100 MG/5ML IV SOSY
PREFILLED_SYRINGE | INTRAVENOUS | Status: DC | PRN
  Administered 2020-05-26: 40 mg via INTRAVENOUS

## 2020-05-26 MED ORDER — PROPOFOL 500 MG/50ML IV EMUL
INTRAVENOUS | Status: DC | PRN
  Administered 2020-05-26: 200 ug/kg/min via INTRAVENOUS

## 2020-05-26 MED ORDER — PROPOFOL 500 MG/50ML IV EMUL
INTRAVENOUS | Status: AC
  Filled 2020-05-26: qty 50

## 2020-05-26 NOTE — H&P (Signed)
[name], MD [address]  [place]  [place], [place] [address]  Main: [phone]  Fax: [phone] Pager: [phone]  Primary Care Physician:  Idelle Crouch, MD Primary Gastroenterologist:  Dr. [name]  Pre-Procedure History & Physical: HPI:  Gordon Carson is a 40 y.o. male is here for an endoscopy.   Past Medical History:  Diagnosis Date  . Anxiety   . Hypertension   . Slipped epiphysis     Past Surgical History:  Procedure Laterality Date  . ANKLE FRACTURE SURGERY Right   . APPENDECTOMY  2004  . COLONOSCOPY WITH PROPOFOL N/A 03/29/2020   Procedure: COLONOSCOPY WITH PROPOFOL;  Surgeon: Lin Landsman, MD;  Location: [facility] ENDOSCOPY;  Service: Gastroenterology;  Laterality: N/A;  . HIP SURGERY Left 1998   pin placed    Prior to Admission medications   Medication Sig Start Date End Date Taking? Authorizing Provider  amLODipine (NORVASC) 10 MG tablet TAKE 1 TABLET(10 MG) BY MOUTH DAILY 02/19/20  Yes Johnson, Megan P, DO  lisinopril (ZESTRIL) 5 MG tablet TAKE 1 TABLET(5 MG) BY MOUTH DAILY 02/18/20  Yes Johnson, Megan P, DO  omeprazole (PRILOSEC) 20 MG capsule Take 1 capsule (20 mg total) by mouth 2 (two) times daily before a meal. 05/17/20 07/16/20 Yes Benedicta Sultan, Tally Due, MD    Allergies as of 05/17/2020 - Review Complete 03/29/2020  Allergen Reaction Noted  . Codeine Nausea And Vomiting 10/13/2014    Family History  Problem Relation Age of Onset  . Cancer Mother        breast  . Hypertension Maternal Grandfather   . Stroke Maternal Grandfather   . Heart disease Maternal Grandfather     Social History   Socioeconomic History  . Marital status: Married    Spouse name: Not on file  . Number of children: Not on file  . Years of education: Not on file  . Highest education level: Not on file  Occupational History  . Not on file  Tobacco Use  . Smoking status: Never Smoker  . Smokeless tobacco: Never Used  Vaping Use  .  Vaping Use: Never used  Substance and Sexual Activity  . Alcohol use: No    Alcohol/week: 0.0 standard drinks  . Drug use: No  . Sexual activity: Yes  Other Topics Concern  . Not on file  Social History Narrative  . Not on file   Social Determinants of Health   Financial Resource Strain: Not on file  Food Insecurity: Not on file  Transportation Needs: Not on file  Physical Activity: Not on file  Stress: Not on file  Social Connections: Not on file  Intimate Partner Violence: Not on file    Review of Systems: See HPI, otherwise negative ROS  Physical Exam: BP 124/81   Pulse 64   Temp (!) 96.6 F (35.9 C) (Temporal)   Resp 18   Ht 6\' 1"  (1.854 m)   Wt 104.3 kg   SpO2 100%   BMI 30.34 kg/m  General:   Alert,  pleasant and cooperative in NAD Head:  Normocephalic and atraumatic. Neck:  Supple; no masses or thyromegaly. Lungs:  Clear throughout to auscultation.    Heart:  Regular rate and rhythm. Abdomen:  Soft, nontender and nondistended. Normal bowel sounds, without guarding, and without rebound.   Neurologic:  Alert and  oriented x4;  grossly normal neurologically.  Impression/Plan: Gordon Carson is here for an endoscopy to be performed for chronic gerd,  epigastric pain  Risks, benefits, limitations, and alternatives regarding  endoscopy have been reviewed with the patient.  Questions have been answered.  All parties agreeable.   Sherri Sear, MD  05/26/2020, 8:20 AM

## 2020-05-26 NOTE — Transfer of Care (Signed)
Immediate Anesthesia Transfer of Care Note  Patient: Gordon Carson  Procedure(s) Performed: Procedure(s): ESOPHAGOGASTRODUODENOSCOPY (EGD) WITH PROPOFOL (N/A)  Patient Location: PACU and Endoscopy Unit  Anesthesia Type:General  Level of Consciousness: sedated  Airway & Oxygen Therapy: Patient Spontanous Breathing and Patient connected to nasal cannula oxygen  Post-op Assessment: Report given to RN and Post -op Vital signs reviewed and stable  Post vital signs: Reviewed and stable  Last Vitals:  Vitals:   05/26/20 0803 05/26/20 0907  BP: 124/81 111/70  Pulse: 64 64  Resp: 18 15  Temp: (!) 35.9 C 36.4 C  SpO2: 741% 63%    Complications: No apparent anesthesia complications

## 2020-05-26 NOTE — Op Note (Signed)
[facility] Gastroenterology Patient Name: Gordon Carson Procedure Date: 05/26/2020 8:53 AM MRN: [id number] Account #: [id number] Date of Birth: [birth year] Admit Type: Outpatient Age: 40 Room: [facility] ENDO ROOM 4 Gender: Male Note Status: Finalized Procedure:             Upper GI endoscopy Indications:           Upper abdominal pain, Heartburn Providers:             Lin Landsman MD, MD Referring MD:          Leonie Douglas. Doy Hutching, MD (Referring MD) Medicines:             General Anesthesia Complications:         No immediate complications. Estimated blood loss: None. Procedure:             Pre-Anesthesia Assessment:                        - Prior to the procedure, a History and Physical was                         performed, and patient medications and allergies were                         reviewed. The patient is competent. The risks and                         benefits of the procedure and the sedation options and                         risks were discussed with the patient. All questions                         were answered and informed consent was obtained.                         Patient identification and proposed procedure were                         verified by the physician, the nurse, the                         anesthesiologist, the anesthetist and the technician                         in the pre-procedure area in the procedure room in the                         endoscopy suite. Mental Status Examination: alert and                         oriented. Airway Examination: normal oropharyngeal                         airway and neck mobility. Respiratory Examination:                         clear to auscultation. CV Examination: normal.  Prophylactic Antibiotics: The patient does not require                         prophylactic antibiotics. Prior Anticoagulants: The                         patient has taken no previous  anticoagulant or                         antiplatelet agents. ASA Grade Assessment: III - A                         patient with severe systemic disease. After reviewing                         the risks and benefits, the patient was deemed in                         satisfactory condition to undergo the procedure. The                         anesthesia plan was to use general anesthesia.                         Immediately prior to administration of medications,                         the patient was re-assessed for adequacy to receive                         sedatives. The heart rate, respiratory rate, oxygen                         saturations, blood pressure, adequacy of pulmonary                         ventilation, and response to care were monitored                         throughout the procedure. The physical status of the                         patient was re-assessed after the procedure.                        After obtaining informed consent, the endoscope was                         passed under direct vision. Throughout the procedure,                         the patient's blood pressure, pulse, and oxygen                         saturations were monitored continuously. The Endoscope                         was introduced through the mouth, and advanced to the  second part of duodenum. The upper GI endoscopy was                         accomplished without difficulty. The patient tolerated                         the procedure well. Findings:      The esophagus was normal. Biopsies were taken with a cold forceps for       histology.      The stomach was normal. Biopsies were taken with a cold forceps for       histology.      The examined duodenum was normal. Impression:            - Normal esophagus. Biopsied.                        - Normal stomach. Biopsied.                        - Normal examined duodenum. Recommendation:        - Await  pathology results.                        - Discharge patient to home (with escort).                        - Resume previous diet today.                        - Continue present medications. Procedure Code(s):     --- Professional ---                        [phone], Esophagogastroduodenoscopy, flexible,                         transoral; with biopsy, single or multiple Diagnosis Code(s):     --- Professional ---                        R10.10, Upper abdominal pain, unspecified                        R12, Heartburn CPT copyright 2019 American Medical Association. All rights reserved. The codes documented in this report are preliminary and upon coder review may  be revised to meet current compliance requirements. Dr. Ulyess Mort Lin Landsman MD, MD 05/26/2020 9:04:12 AM This report has been signed electronically. Number of Addenda: 0 Note Initiated On: 05/26/2020 8:53 AM Estimated Blood Loss:  Estimated blood loss: none.      [facility]

## 2020-05-26 NOTE — Anesthesia Procedure Notes (Signed)
Date/Time: 05/26/2020 8:53 AM Performed by: Doreen Salvage, CRNA Pre-anesthesia Checklist: Patient identified, Emergency Drugs available, Suction available and Patient being monitored Patient Re-evaluated:Patient Re-evaluated prior to induction Oxygen Delivery Method: Nasal cannula Induction Type: IV induction Dental Injury: Teeth and Oropharynx as per pre-operative assessment  Comments: Nasal cannula with etCO2 monitoring

## 2020-05-26 NOTE — Anesthesia Preprocedure Evaluation (Signed)
Anesthesia Evaluation  Patient identified by MRN, date of birth, ID band Patient awake    Reviewed: Allergy & Precautions, H&P , NPO status , Patient's Chart, lab work & pertinent test results  History of Anesthesia Complications Negative for: history of anesthetic complications  Airway Mallampati: III  TM Distance: <3 FB Neck ROM: full    Dental  (+) Chipped   Pulmonary neg pulmonary ROS, neg shortness of breath,    Pulmonary exam normal        Cardiovascular Exercise Tolerance: Good hypertension, (-) angina(-) Past MI and (-) DOE Normal cardiovascular exam     Neuro/Psych PSYCHIATRIC DISORDERS negative neurological ROS     GI/Hepatic Neg liver ROS, GERD  Medicated and Controlled,  Endo/Other  negative endocrine ROS  Renal/GU Renal disease  negative genitourinary   Musculoskeletal   Abdominal   Peds  Hematology negative hematology ROS (+)   Anesthesia Other Findings Past Medical History: No date: Anxiety No date: Hypertension No date: Slipped epiphysis  Past Surgical History: No date: ANKLE FRACTURE SURGERY; Right 2004: APPENDECTOMY 03/29/2020: COLONOSCOPY WITH PROPOFOL; N/A     Comment:  Procedure: COLONOSCOPY WITH PROPOFOL;  Surgeon: Lin Landsman, MD;  Location: [facility] ENDOSCOPY;  Service:               Gastroenterology;  Laterality: N/A; 1998: HIP SURGERY; Left     Comment:  pin placed  BMI    Body Mass Index: 30.34 kg/m      Reproductive/Obstetrics negative OB ROS                             Anesthesia Physical Anesthesia Plan  ASA: III  Anesthesia Plan: General   Post-op Pain Management:    Induction: Intravenous  PONV Risk Score and Plan: Propofol infusion and TIVA  Airway Management Planned: Natural Airway and Nasal Cannula  Additional Equipment:   Intra-op Plan:   Post-operative Plan:   Informed Consent: I have reviewed the  patients History and Physical, chart, labs and discussed the procedure including the risks, benefits and alternatives for the proposed anesthesia with the patient or authorized representative who has indicated his/her understanding and acceptance.     Dental Advisory Given  Plan Discussed with: Anesthesiologist, CRNA and Surgeon  Anesthesia Plan Comments: (Patient consented for risks of anesthesia including but not limited to:  - adverse reactions to medications - risk of airway placement if required - damage to eyes, teeth, lips or other oral mucosa - nerve damage due to positioning  - sore throat or hoarseness - Damage to heart, brain, nerves, lungs, other parts of body or loss of life  Patient voiced understanding.)        Anesthesia Quick Evaluation

## 2020-05-26 NOTE — Anesthesia Postprocedure Evaluation (Signed)
Anesthesia Post Note  Patient: Gordon Carson  Procedure(s) Performed: ESOPHAGOGASTRODUODENOSCOPY (EGD) WITH PROPOFOL (N/A )  Patient location during evaluation: Endoscopy Anesthesia Type: General Level of consciousness: awake and alert Pain management: pain level controlled Vital Signs Assessment: post-procedure vital signs reviewed and stable Respiratory status: spontaneous breathing, nonlabored ventilation, respiratory function stable and patient connected to nasal cannula oxygen Cardiovascular status: blood pressure returned to baseline and stable Postop Assessment: no apparent nausea or vomiting Anesthetic complications: no   No complications documented.   Last Vitals:  Vitals:   05/26/20 0927 05/26/20 0937  BP: 106/74 113/80  Pulse: (!) 54 (!) 58  Resp: 13 11  Temp:    SpO2: 98% 97%    Last Pain:  Vitals:   05/26/20 0937  TempSrc:   PainSc: 0-No pain                 Precious Haws Jolan Mealor

## 2020-05-27 ENCOUNTER — Encounter: Payer: Self-pay | Admitting: Gastroenterology

## 2020-05-27 LAB — SURGICAL PATHOLOGY

## 2020-08-13 ENCOUNTER — Other Ambulatory Visit: Payer: Self-pay | Admitting: Family Medicine

## 2020-11-03 MED ORDER — DIPHENHYDRAMINE HCL 25 MG PO CAPS: 25.0000 mg | ORAL_CAPSULE | ORAL | Status: AC

## 2020-11-03 MED ORDER — PROMETHAZINE HCL 25 MG/ML IJ SOLN: 25.0000 mg | INTRAMUSCULAR | Status: AC

## 2020-11-03 MED ORDER — MORPHINE SULFATE (PF) 10 MG/ML IV SOLN: 10.0000 mg | INTRAVENOUS | Status: AC

## 2020-11-03 MED ORDER — MIDAZOLAM HCL 2 MG/2ML IJ SOLN: 1.0000 mg | INTRAMUSCULAR | Status: AC

## 2020-11-03 MED ORDER — DEXTROSE-NACL 5-0.45 % IV SOLN: INTRAVENOUS | Status: DC

## 2020-11-03 MED ORDER — LEVOFLOXACIN 500 MG PO TABS: 500.0000 mg | ORAL_TABLET | ORAL | Status: AC

## 2020-11-04 ENCOUNTER — Encounter: Payer: Self-pay | Admitting: Urology

## 2020-11-04 ENCOUNTER — Encounter
Admission: RE | Disposition: A | Discharge status: Home / Self Care | Payer: Self-pay | Source: Home / Self Care | Attending: Urology

## 2020-11-04 ENCOUNTER — Ambulatory Visit
Admission: RE | Admit: 2020-11-04 | Discharge: 2020-11-04 | Disposition: A | Discharge status: Home / Self Care | Payer: 59 | Attending: Urology | Admitting: Urology

## 2020-11-04 DIAGNOSIS — N200 Calculus of kidney: Secondary | ICD-10-CM | POA: Diagnosis not present

## 2020-11-04 DIAGNOSIS — Z79899 Other long term (current) drug therapy: Secondary | ICD-10-CM | POA: Insufficient documentation

## 2020-11-04 DIAGNOSIS — I10 Essential (primary) hypertension: Secondary | ICD-10-CM | POA: Insufficient documentation

## 2020-11-04 HISTORY — PX: EXTRACORPOREAL SHOCK WAVE LITHOTRIPSY: SHX1557

## 2020-11-04 HISTORY — DX: Personal history of urinary calculi: Z87.442

## 2020-11-04 SURGERY — LITHOTRIPSY, ESWL
Anesthesia: Moderate Sedation | Laterality: Left

## 2020-11-04 MED ORDER — LEVOFLOXACIN 500 MG PO TABS
ORAL_TABLET | ORAL | Status: AC
  Administered 2020-11-04: 500 mg via ORAL
  Filled 2020-11-04: qty 1

## 2020-11-04 MED ORDER — MORPHINE SULFATE (PF) 10 MG/ML IV SOLN
INTRAVENOUS | Status: AC
  Administered 2020-11-04: 10 mg via INTRAMUSCULAR
  Filled 2020-11-04: qty 1

## 2020-11-04 MED ORDER — PROMETHAZINE HCL 25 MG/ML IJ SOLN
INTRAMUSCULAR | Status: AC
  Filled 2020-11-04: qty 1

## 2020-11-04 MED ORDER — FUROSEMIDE 10 MG/ML IJ SOLN
INTRAMUSCULAR | Status: AC
  Filled 2020-11-04: qty 2

## 2020-11-04 MED ORDER — TAMSULOSIN HCL 0.4 MG PO CAPS: 0.4000 mg | ORAL_CAPSULE | Freq: Every day | ORAL | 1 refills | Status: AC

## 2020-11-04 MED ORDER — MIDAZOLAM HCL 2 MG/2ML IJ SOLN
INTRAMUSCULAR | Status: AC
  Filled 2020-11-04: qty 2

## 2020-11-04 MED ORDER — DIPHENHYDRAMINE HCL 25 MG PO CAPS
ORAL_CAPSULE | ORAL | Status: AC
  Administered 2020-11-04: 25 mg via ORAL
  Filled 2020-11-04: qty 1

## 2020-11-04 MED ORDER — HYDROCODONE-ACETAMINOPHEN 5-325 MG PO TABS: 1.0000 | ORAL_TABLET | Freq: Four times a day (QID) | ORAL | 0 refills | Status: AC | PRN

## 2020-11-04 MED ORDER — ONDANSETRON 8 MG PO TBDP: 8.0000 mg | ORAL_TABLET | Freq: Four times a day (QID) | ORAL | 3 refills | Status: AC | PRN

## 2020-11-04 MED ORDER — FUROSEMIDE 10 MG/ML IJ SOLN
10.0000 mg | Freq: Once | INTRAMUSCULAR | Status: AC
  Administered 2020-11-04: 10 mg via INTRAVENOUS

## 2020-11-04 MED ORDER — PROMETHAZINE HCL 25 MG/ML IJ SOLN
INTRAMUSCULAR | Status: AC
  Administered 2020-11-04: 25 mg via INTRAMUSCULAR
  Filled 2020-11-04: qty 1

## 2020-11-04 MED ORDER — MIDAZOLAM HCL 2 MG/2ML IJ SOLN
INTRAMUSCULAR | Status: AC
  Administered 2020-11-04: 1 mg via INTRAMUSCULAR
  Filled 2020-11-04: qty 2

## 2020-11-04 NOTE — Discharge Instructions (Addendum)
AMBULATORY SURGERY  DISCHARGE INSTRUCTIONS   The drugs that you were given will stay in your system until tomorrow so for the next 24 hours you should not:  Drive an automobile Make any legal decisions Drink any alcoholic beverage   You may resume regular meals tomorrow.  Today it is better to start with liquids and gradually work up to solid foods.  You may eat anything you prefer, but it is better to start with liquids, then soup and crackers, and gradually work up to solid foods.   Please notify your doctor immediately if you have any unusual bleeding, trouble breathing, redness and pain at the surgery site, drainage, fever, or pain not relieved by medication.    Additional Instructions:  Follow [facility] instructions     Please contact your physician with any problems or Same Day Surgery at [phone], Monday through Friday 6 am to 4 pm, or [place] at [facility] number at [phone]. esw

## 2020-11-05 ENCOUNTER — Encounter: Payer: Self-pay | Admitting: Urology

## 2020-11-06 ENCOUNTER — Other Ambulatory Visit: Payer: Self-pay

## 2020-11-06 DIAGNOSIS — W208XXA Other cause of strike by thrown, projected or falling object, initial encounter: Secondary | ICD-10-CM | POA: Insufficient documentation

## 2020-11-06 DIAGNOSIS — S0990XA Unspecified injury of head, initial encounter: Secondary | ICD-10-CM | POA: Diagnosis present

## 2020-11-06 DIAGNOSIS — Z79899 Other long term (current) drug therapy: Secondary | ICD-10-CM | POA: Insufficient documentation

## 2020-11-06 DIAGNOSIS — S0003XA Contusion of scalp, initial encounter: Secondary | ICD-10-CM | POA: Insufficient documentation

## 2020-11-06 DIAGNOSIS — Z23 Encounter for immunization: Secondary | ICD-10-CM | POA: Diagnosis not present

## 2020-11-06 DIAGNOSIS — I10 Essential (primary) hypertension: Secondary | ICD-10-CM | POA: Insufficient documentation

## 2020-11-06 NOTE — ED Triage Notes (Signed)
Router fell onto R side of head while building shelf, 2in lac noted with bleeding controlled. denies LOC or blurred vision

## 2020-11-07 ENCOUNTER — Emergency Department
Admission: EM | Admit: 2020-11-07 | Discharge: 2020-11-07 | Disposition: A | Discharge status: Home / Self Care | Payer: 59 | Attending: Emergency Medicine | Admitting: Emergency Medicine

## 2020-11-07 DIAGNOSIS — S0101XA Laceration without foreign body of scalp, initial encounter: Secondary | ICD-10-CM

## 2020-11-07 MED ORDER — TETANUS-DIPHTH-ACELL PERTUSSIS 5-2.5-18.5 LF-MCG/0.5 IM SUSY
0.5000 mL | PREFILLED_SYRINGE | Freq: Once | INTRAMUSCULAR | Status: AC
  Administered 2020-11-07: 0.5 mL via INTRAMUSCULAR
  Filled 2020-11-07: qty 0.5

## 2020-11-07 NOTE — ED Provider Notes (Signed)
[facility] Emergency Department Provider Note   ____________________________________________   Event Date/Time   First MD Initiated Contact with Patient 11/07/20 0012     (approximate)  I have reviewed the triage vital signs and the nursing notes.   HISTORY  Chief Complaint Laceration    HPI JORON VELIS is a 40 y.o. male with past medical history of hypertension, GERD, and anxiety who presents to the ED complaining of laceration.  Patient reports that he was building some cabinets earlier this evening and dropped the router he was using.  He states it hit him on the right side of his scalp and he had significant bleeding at home.  He denies losing consciousness and denies any headache or neck pain at this time.  He was concerned he could need stitches due to the level of bleeding.  He denies any vision changes, numbness, or weakness.  He does not take any blood thinners.        Past Medical History:  Diagnosis Date   Anxiety    History of kidney stones    Hypertension    Slipped epiphysis     Patient Active Problem List   Diagnosis Date Noted   Chronic GERD    Kidney stones 03/02/2020   Hypertension    Controlled substance agreement signed 06/22/2015   Palpitations 05/07/2015   Acute anxiety 11/02/2014   Benign hypertensive renal disease 10/14/2014   Obesity 10/14/2014   Allergic rhinitis 10/14/2014   History of ADHD 10/14/2014   Elevated cholesterol 10/14/2014    Past Surgical History:  Procedure Laterality Date   ANKLE FRACTURE SURGERY Right    APPENDECTOMY  2004   COLONOSCOPY WITH PROPOFOL N/A 03/29/2020   Procedure: COLONOSCOPY WITH PROPOFOL;  Surgeon: Lin Landsman, MD;  Location: [place];  Service: Gastroenterology;  Laterality: N/A;   ESOPHAGOGASTRODUODENOSCOPY (EGD) WITH PROPOFOL N/A 05/26/2020   Procedure: ESOPHAGOGASTRODUODENOSCOPY (EGD) WITH PROPOFOL;  Surgeon: Lin Landsman, MD;  Location: [facility]  ENDOSCOPY;  Service: Gastroenterology;  Laterality: N/A;   EXTRACORPOREAL SHOCK WAVE LITHOTRIPSY Left 11/04/2020   Procedure: EXTRACORPOREAL SHOCK WAVE LITHOTRIPSY (ESWL);  Surgeon: Royston Cowper, MD;  Location: [facility] ORS;  Service: Urology;  Laterality: Left;   HIP SURGERY Left 1998   pin placed    Prior to Admission medications   Medication Sig Start Date End Date Taking? Authorizing Provider  amLODipine (NORVASC) 10 MG tablet TAKE 1 TABLET(10 MG) BY MOUTH DAILY 02/19/20   Johnson, Megan P, DO  HYDROcodone-acetaminophen (NORCO/VICODIN) 5-325 MG tablet Take 1 tablet by mouth every 6 (six) hours as needed for moderate pain. 11/04/20   Royston Cowper, MD  lisinopril (ZESTRIL) 5 MG tablet TAKE 1 TABLET(5 MG) BY MOUTH DAILY 02/18/20   Wynetta Emery, Megan P, DO  omeprazole (PRILOSEC) 20 MG capsule Take 1 capsule (20 mg total) by mouth 2 (two) times daily before a meal. 05/17/20 07/16/20  Vanga, Tally Due, MD  ondansetron (ZOFRAN ODT) 8 MG disintegrating tablet Take 1 tablet (8 mg total) by mouth every 6 (six) hours as needed for nausea or vomiting. 11/04/20   Royston Cowper, MD  tamsulosin (FLOMAX) 0.4 MG CAPS capsule Take 1 capsule (0.4 mg total) by mouth daily. 11/04/20   Royston Cowper, MD    Allergies Codeine  Family History  Problem Relation Age of Onset   Cancer Mother        breast   Hypertension Maternal Grandfather    Stroke Maternal Grandfather  Heart disease Maternal Grandfather     Social History Social History   Tobacco Use   Smoking status: Never   Smokeless tobacco: Never  Vaping Use   Vaping Use: Never used  Substance Use Topics   Alcohol use: No    Alcohol/week: 0.0 standard drinks   Drug use: No    Review of Systems  Constitutional: No fever/chills Eyes: No visual changes. ENT: No sore throat. Cardiovascular: Denies chest pain. Respiratory: Denies shortness of breath. Gastrointestinal: No abdominal pain.  No nausea, no vomiting.  No diarrhea.  No  constipation. Genitourinary: Negative for dysuria. Musculoskeletal: Negative for back pain. Skin: Negative for rash.  Positive for laceration. Neurological: Negative for headaches, focal weakness or numbness.  ____________________________________________   PHYSICAL EXAM:  VITAL SIGNS: ED Triage Vitals  Enc Vitals Group     BP 11/06/20 2050 (!) 132/91     Pulse Rate 11/06/20 2050 79     Resp 11/06/20 2050 16     Temp 11/06/20 2050 97.8 F (36.6 C)     Temp Source 11/06/20 2050 Oral     SpO2 11/06/20 2050 97 %     Weight 11/06/20 2051 229 lb 4.5 oz (104 kg)     Height 11/06/20 2051 6\' 1"  (1.854 m)     Head Circumference --      Peak Flow --      Pain Score 11/06/20 2051 0     Pain Loc --      Pain Edu? --      Excl. in [place]? --     Constitutional: Alert and oriented. Eyes: Conjunctivae are normal. Head: Superficial nonbleeding laceration to right parietal scalp. Nose: No congestion/rhinnorhea. Mouth/Throat: Mucous membranes are moist. Neck: Normal ROM, no midline cervical spine tenderness to palpation. Cardiovascular: Normal rate, regular rhythm. Grossly normal heart sounds. Respiratory: Normal respiratory effort.  No retractions. Lungs CTAB. Gastrointestinal: Soft and nontender. No distention. Genitourinary: deferred Musculoskeletal: No lower extremity tenderness nor edema. Neurologic:  Normal speech and language. No gross focal neurologic deficits are appreciated. Skin:  Skin is warm, dry and intact. No rash noted. Psychiatric: Mood and affect are normal. Speech and behavior are normal.  ____________________________________________   LABS (all labs ordered are listed, but only abnormal results are displayed)  Labs Reviewed - No data to display   PROCEDURES  Procedure(s) performed (including Critical Care):  Procedures   ____________________________________________   INITIAL IMPRESSION / ASSESSMENT AND PLAN / ED COURSE      40 year old male with past  medical history of hypertension, GERD, and anxiety who presents to the ED after dropping a router on the top of his head causing a laceration.  Laceration is superficial and well approximated, no active bleeding noted.  He denies LOC and is not anticoagulated, no indication for CT head at this time.  Laceration does not require repair at this time, wound was cleaned and covered with antibiotic ointment.  Patient's tetanus was updated.  He is appropriate for discharge home and counseled to return to the ED for new or worsening symptoms, patient agrees with plan.      ____________________________________________   FINAL CLINICAL IMPRESSION(S) / ED DIAGNOSES  Final diagnoses:  Laceration of scalp, initial encounter     ED Discharge Orders     None        Note:  This document was prepared using Dragon voice recognition software and may include unintentional dictation errors.    Blake Divine, MD 11/07/20 [phone]

## 2021-09-05 DIAGNOSIS — Z79899 Other long term (current) drug therapy: Secondary | ICD-10-CM | POA: Diagnosis not present

## 2021-09-05 DIAGNOSIS — I10 Essential (primary) hypertension: Secondary | ICD-10-CM | POA: Diagnosis not present

## 2021-09-05 DIAGNOSIS — Z125 Encounter for screening for malignant neoplasm of prostate: Secondary | ICD-10-CM | POA: Diagnosis not present

## 2021-09-05 DIAGNOSIS — E7800 Pure hypercholesterolemia, unspecified: Secondary | ICD-10-CM | POA: Diagnosis not present

## 2021-09-05 DIAGNOSIS — Z0000 Encounter for general adult medical examination without abnormal findings: Secondary | ICD-10-CM | POA: Diagnosis not present

## 2021-09-05 DIAGNOSIS — K529 Noninfective gastroenteritis and colitis, unspecified: Secondary | ICD-10-CM | POA: Diagnosis not present

## 2021-09-07 DIAGNOSIS — L918 Other hypertrophic disorders of the skin: Secondary | ICD-10-CM | POA: Diagnosis not present

## 2021-09-07 DIAGNOSIS — Z86018 Personal history of other benign neoplasm: Secondary | ICD-10-CM | POA: Diagnosis not present

## 2021-09-07 DIAGNOSIS — L578 Other skin changes due to chronic exposure to nonionizing radiation: Secondary | ICD-10-CM | POA: Diagnosis not present

## 2021-09-07 DIAGNOSIS — L218 Other seborrheic dermatitis: Secondary | ICD-10-CM | POA: Diagnosis not present

## 2021-09-07 DIAGNOSIS — L738 Other specified follicular disorders: Secondary | ICD-10-CM | POA: Diagnosis not present

## 2021-09-27 DIAGNOSIS — I10 Essential (primary) hypertension: Secondary | ICD-10-CM | POA: Diagnosis not present

## 2021-09-27 DIAGNOSIS — R0683 Snoring: Secondary | ICD-10-CM | POA: Diagnosis not present

## 2021-10-02 DIAGNOSIS — G4733 Obstructive sleep apnea (adult) (pediatric): Secondary | ICD-10-CM | POA: Diagnosis not present

## 2021-12-20 DIAGNOSIS — E7800 Pure hypercholesterolemia, unspecified: Secondary | ICD-10-CM | POA: Diagnosis not present

## 2022-02-07 DIAGNOSIS — I10 Essential (primary) hypertension: Secondary | ICD-10-CM | POA: Diagnosis not present

## 2022-02-07 DIAGNOSIS — J40 Bronchitis, not specified as acute or chronic: Secondary | ICD-10-CM | POA: Diagnosis not present

## 2022-03-07 DIAGNOSIS — Z79899 Other long term (current) drug therapy: Secondary | ICD-10-CM | POA: Diagnosis not present

## 2022-03-07 DIAGNOSIS — I10 Essential (primary) hypertension: Secondary | ICD-10-CM | POA: Diagnosis not present

## 2022-03-07 DIAGNOSIS — E7800 Pure hypercholesterolemia, unspecified: Secondary | ICD-10-CM | POA: Diagnosis not present

## 2022-09-12 DIAGNOSIS — Z79899 Other long term (current) drug therapy: Secondary | ICD-10-CM | POA: Diagnosis not present

## 2022-09-12 DIAGNOSIS — L218 Other seborrheic dermatitis: Secondary | ICD-10-CM | POA: Diagnosis not present

## 2022-09-12 DIAGNOSIS — L732 Hidradenitis suppurativa: Secondary | ICD-10-CM | POA: Diagnosis not present

## 2022-09-12 DIAGNOSIS — Z86018 Personal history of other benign neoplasm: Secondary | ICD-10-CM | POA: Diagnosis not present

## 2022-09-12 DIAGNOSIS — L578 Other skin changes due to chronic exposure to nonionizing radiation: Secondary | ICD-10-CM | POA: Diagnosis not present

## 2022-09-12 DIAGNOSIS — L738 Other specified follicular disorders: Secondary | ICD-10-CM | POA: Diagnosis not present

## 2022-10-10 DIAGNOSIS — M722 Plantar fascial fibromatosis: Secondary | ICD-10-CM | POA: Diagnosis not present

## 2022-10-10 DIAGNOSIS — M79671 Pain in right foot: Secondary | ICD-10-CM | POA: Diagnosis not present

## 2022-11-15 DIAGNOSIS — L732 Hidradenitis suppurativa: Secondary | ICD-10-CM | POA: Diagnosis not present

## 2022-11-15 DIAGNOSIS — Z79899 Other long term (current) drug therapy: Secondary | ICD-10-CM | POA: Diagnosis not present

## 2023-02-18 DIAGNOSIS — J019 Acute sinusitis, unspecified: Secondary | ICD-10-CM | POA: Diagnosis not present

## 2023-02-18 DIAGNOSIS — R051 Acute cough: Secondary | ICD-10-CM | POA: Diagnosis not present

## 2023-02-18 DIAGNOSIS — J209 Acute bronchitis, unspecified: Secondary | ICD-10-CM | POA: Diagnosis not present

## 2023-02-18 DIAGNOSIS — B9689 Other specified bacterial agents as the cause of diseases classified elsewhere: Secondary | ICD-10-CM | POA: Diagnosis not present

## 2023-04-09 DIAGNOSIS — K529 Noninfective gastroenteritis and colitis, unspecified: Secondary | ICD-10-CM | POA: Diagnosis not present

## 2023-04-09 DIAGNOSIS — Z125 Encounter for screening for malignant neoplasm of prostate: Secondary | ICD-10-CM | POA: Diagnosis not present

## 2023-04-09 DIAGNOSIS — Z0000 Encounter for general adult medical examination without abnormal findings: Secondary | ICD-10-CM | POA: Diagnosis not present

## 2023-04-09 DIAGNOSIS — I10 Essential (primary) hypertension: Secondary | ICD-10-CM | POA: Diagnosis not present

## 2023-04-09 DIAGNOSIS — N200 Calculus of kidney: Secondary | ICD-10-CM | POA: Diagnosis not present

## 2023-04-09 DIAGNOSIS — E7800 Pure hypercholesterolemia, unspecified: Secondary | ICD-10-CM | POA: Diagnosis not present

## 2023-04-09 DIAGNOSIS — Z79899 Other long term (current) drug therapy: Secondary | ICD-10-CM | POA: Diagnosis not present

## 2023-05-18 DIAGNOSIS — Z79899 Other long term (current) drug therapy: Secondary | ICD-10-CM | POA: Diagnosis not present

## 2023-06-05 DIAGNOSIS — Z79899 Other long term (current) drug therapy: Secondary | ICD-10-CM | POA: Diagnosis not present

## 2023-06-05 DIAGNOSIS — L732 Hidradenitis suppurativa: Secondary | ICD-10-CM | POA: Diagnosis not present

## 2023-06-05 DIAGNOSIS — L304 Erythema intertrigo: Secondary | ICD-10-CM | POA: Diagnosis not present

## 2023-06-26 DIAGNOSIS — J111 Influenza due to unidentified influenza virus with other respiratory manifestations: Secondary | ICD-10-CM | POA: Diagnosis not present

## 2023-06-26 DIAGNOSIS — Z03818 Encounter for observation for suspected exposure to other biological agents ruled out: Secondary | ICD-10-CM | POA: Diagnosis not present
